# Patient Record
Sex: Female | Born: 1935 | Race: White | Hispanic: No | Smoking: Never smoker
Health system: Southern US, Community
[De-identification: ages and names within clinical notes are randomized; demographics above are authoritative.]

## PROBLEM LIST (undated history)

## (undated) DIAGNOSIS — E785 Hyperlipidemia, unspecified: Secondary | ICD-10-CM

## (undated) DIAGNOSIS — F0280 Dementia in other diseases classified elsewhere without behavioral disturbance: Secondary | ICD-10-CM

## (undated) DIAGNOSIS — I639 Cerebral infarction, unspecified: Secondary | ICD-10-CM

## (undated) DIAGNOSIS — R296 Repeated falls: Secondary | ICD-10-CM

## (undated) DIAGNOSIS — I10 Essential (primary) hypertension: Secondary | ICD-10-CM

## (undated) DIAGNOSIS — R32 Unspecified urinary incontinence: Secondary | ICD-10-CM

## (undated) DIAGNOSIS — R29898 Other symptoms and signs involving the musculoskeletal system: Secondary | ICD-10-CM

---

## 2019-03-26 ENCOUNTER — Encounter (HOSPITAL_COMMUNITY): Payer: Self-pay | Admitting: Emergency Medicine

## 2019-03-26 ENCOUNTER — Observation Stay (HOSPITAL_COMMUNITY)
Admission: EM | Admit: 2019-03-26 | Discharge: 2019-03-27 | Disposition: A | Discharge status: Home / Self Care | Payer: Medicare Other | Attending: Family Medicine | Admitting: Family Medicine

## 2019-03-26 ENCOUNTER — Emergency Department (HOSPITAL_COMMUNITY): Payer: Medicare Other

## 2019-03-26 ENCOUNTER — Other Ambulatory Visit: Payer: Self-pay

## 2019-03-26 DIAGNOSIS — G309 Alzheimer's disease, unspecified: Secondary | ICD-10-CM | POA: Insufficient documentation

## 2019-03-26 DIAGNOSIS — W19XXXA Unspecified fall, initial encounter: Secondary | ICD-10-CM | POA: Insufficient documentation

## 2019-03-26 DIAGNOSIS — F0280 Dementia in other diseases classified elsewhere without behavioral disturbance: Secondary | ICD-10-CM | POA: Diagnosis not present

## 2019-03-26 DIAGNOSIS — I629 Nontraumatic intracranial hemorrhage, unspecified: Secondary | ICD-10-CM

## 2019-03-26 DIAGNOSIS — Z8673 Personal history of transient ischemic attack (TIA), and cerebral infarction without residual deficits: Secondary | ICD-10-CM | POA: Insufficient documentation

## 2019-03-26 DIAGNOSIS — Z79899 Other long term (current) drug therapy: Secondary | ICD-10-CM | POA: Insufficient documentation

## 2019-03-26 DIAGNOSIS — R296 Repeated falls: Secondary | ICD-10-CM

## 2019-03-26 DIAGNOSIS — S06300A Unspecified focal traumatic brain injury without loss of consciousness, initial encounter: Secondary | ICD-10-CM | POA: Diagnosis not present

## 2019-03-26 DIAGNOSIS — Z20822 Contact with and (suspected) exposure to covid-19: Secondary | ICD-10-CM | POA: Insufficient documentation

## 2019-03-26 DIAGNOSIS — Z66 Do not resuscitate: Secondary | ICD-10-CM | POA: Diagnosis present

## 2019-03-26 DIAGNOSIS — Y999 Unspecified external cause status: Secondary | ICD-10-CM | POA: Insufficient documentation

## 2019-03-26 DIAGNOSIS — I10 Essential (primary) hypertension: Secondary | ICD-10-CM | POA: Diagnosis not present

## 2019-03-26 DIAGNOSIS — Z7982 Long term (current) use of aspirin: Secondary | ICD-10-CM | POA: Insufficient documentation

## 2019-03-26 DIAGNOSIS — Y92121 Bathroom in nursing home as the place of occurrence of the external cause: Secondary | ICD-10-CM | POA: Insufficient documentation

## 2019-03-26 DIAGNOSIS — S0003XA Contusion of scalp, initial encounter: Secondary | ICD-10-CM

## 2019-03-26 DIAGNOSIS — S0990XA Unspecified injury of head, initial encounter: Secondary | ICD-10-CM

## 2019-03-26 DIAGNOSIS — F0390 Unspecified dementia without behavioral disturbance: Secondary | ICD-10-CM | POA: Diagnosis present

## 2019-03-26 DIAGNOSIS — Y939 Activity, unspecified: Secondary | ICD-10-CM | POA: Diagnosis not present

## 2019-03-26 HISTORY — DX: Essential (primary) hypertension: I10

## 2019-03-26 HISTORY — DX: Unspecified urinary incontinence: R32

## 2019-03-26 HISTORY — DX: Cerebral infarction, unspecified: I63.9

## 2019-03-26 HISTORY — DX: Repeated falls: R29.6

## 2019-03-26 HISTORY — DX: Hyperlipidemia, unspecified: E78.5

## 2019-03-26 HISTORY — DX: Other symptoms and signs involving the musculoskeletal system: R29.898

## 2019-03-26 HISTORY — DX: Dementia in other diseases classified elsewhere, unspecified severity, without behavioral disturbance, psychotic disturbance, mood disturbance, and anxiety: F02.80

## 2019-03-26 LAB — BASIC METABOLIC PANEL
Anion gap: 11 (ref 5–15)
BUN: 24 mg/dL — ABNORMAL HIGH (ref 8–23)
CO2: 24 mmol/L (ref 22–32)
Calcium: 8.9 mg/dL (ref 8.9–10.3)
Chloride: 107 mmol/L (ref 98–111)
Creatinine, Ser: 1.21 mg/dL — ABNORMAL HIGH (ref 0.44–1.00)
GFR calc Af Amer: 48 mL/min — ABNORMAL LOW (ref >60)
GFR calc non Af Amer: 41 mL/min — ABNORMAL LOW (ref >60)
Glucose, Bld: 95 mg/dL (ref 70–99)
Potassium: 3.8 mmol/L (ref 3.5–5.1)
Sodium: 142 mmol/L (ref 135–145)

## 2019-03-26 LAB — CBC WITH DIFFERENTIAL/PLATELET
Abs Immature Granulocytes: 0.04 10*3/uL (ref 0.00–0.07)
Basophils Absolute: 0.1 10*3/uL (ref 0.0–0.1)
Basophils Relative: 1 %
Eosinophils Absolute: 0.1 10*3/uL (ref 0.0–0.5)
Eosinophils Relative: 2 %
HCT: 40.9 % (ref 36.0–46.0)
Hemoglobin: 13.1 g/dL (ref 12.0–15.0)
Immature Granulocytes: 1 %
Lymphocytes Relative: 23 %
Lymphs Abs: 1.7 10*3/uL (ref 0.7–4.0)
MCH: 30 pg (ref 26.0–34.0)
MCHC: 32 g/dL (ref 30.0–36.0)
MCV: 93.8 fL (ref 80.0–100.0)
Monocytes Absolute: 0.6 10*3/uL (ref 0.1–1.0)
Monocytes Relative: 7 %
Neutro Abs: 5.1 10*3/uL (ref 1.7–7.7)
Neutrophils Relative %: 66 %
Platelets: 227 10*3/uL (ref 150–400)
RBC: 4.36 MIL/uL (ref 3.87–5.11)
RDW: 13.3 % (ref 11.5–15.5)
WBC: 7.6 10*3/uL (ref 4.0–10.5)
nRBC: 0 % (ref 0.0–0.2)

## 2019-03-26 MED ORDER — ACETAMINOPHEN 325 MG PO TABS
650.0000 mg | ORAL_TABLET | Freq: Once | ORAL | Status: AC
  Administered 2019-03-26: 650 mg via ORAL
  Filled 2019-03-26: qty 2

## 2019-03-26 NOTE — ED Notes (Signed)
Patient transported to CT 

## 2019-03-26 NOTE — ED Notes (Signed)
Heather Carson daughter [phone] call with any updates

## 2019-03-26 NOTE — ED Provider Notes (Signed)
[facility] EMERGENCY DEPARTMENT Provider Note   CSN: [id number] Arrival date & time: 03/26/19  2030     History No chief complaint on file.   Heather Carson is a 86 y.o. female with past medical history of hypertension, prior history of stroke, Alzheimer's presenting to the ED after unwitnessed fall that occurred at nursing facility.  Patient states that she was in the bathroom when she lost her balance, fell and hit the back of her head on the wall.  She denies any loss of consciousness.  She complains of pain to the side of her head near the area of the hematoma.  She denies any vision changes, vomiting, numbness in arms or legs.  She is amatory at baseline with a walker and a cane.  She takes a daily aspirin but no other anticoagulation noted.  Denies any vomiting, diarrhea, chest pain, shortness of breath.  HPI     Past Medical History:  Diagnosis Date  . Alzheimer disease ([place])   . HLD (hyperlipidemia)   . Hypertension   . Incontinence   . Recurrent falls   . Stroke ([place])   . Weakness of both legs     There are no problems to display for this patient.    OB History   No obstetric history on file.     No family history on file.  Social History   Tobacco Use  . Smoking status: Not on file  Substance Use Topics  . Alcohol use: Not on file  . Drug use: Not on file    Home Medications Prior to Admission medications   Medication Sig Start Date End Date Taking? Authorizing Provider  ALPRAZolam Duanne Moron) 0.5 MG tablet Take 0.5 mg by mouth at bedtime.   Yes [provider]  amLODipine (NORVASC) 5 MG tablet Take 5 mg by mouth daily.   Yes [provider]  aspirin EC 81 MG tablet Take 81 mg by mouth daily.   Yes [provider]  buPROPion (WELLBUTRIN XL) 150 MG 24 hr tablet Take 150 mg by mouth daily.   Yes [provider]  losartan (COZAAR) 100 MG tablet Take 100 mg by mouth daily.   Yes [provider]    meclizine (ANTIVERT) 12.5 MG tablet Take 12.5 mg by mouth at bedtime as needed for dizziness.   Yes [provider]  Melatonin 10 MG SUBL Place 10 mg under the tongue at bedtime.   Yes [provider]  sertraline (ZOLOFT) 100 MG tablet Take 200 mg by mouth daily.   Yes [provider]  rivastigmine (EXELON) 4.6 mg/24hr Place 4.6 mg onto the skin daily.    [provider]    Allergies    Atorvastatin, Codeine, Other, and Sulfa antibiotics  Review of Systems   Review of Systems  Constitutional: Negative for appetite change, chills and fever.  HENT: Negative for ear pain, rhinorrhea, sneezing and sore throat.   Eyes: Negative for photophobia and visual disturbance.  Respiratory: Negative for cough, chest tightness, shortness of breath and wheezing.   Cardiovascular: Negative for chest pain and palpitations.  Gastrointestinal: Negative for abdominal pain, blood in stool, constipation, diarrhea, nausea and vomiting.  Genitourinary: Negative for dysuria, hematuria and urgency.  Musculoskeletal: Positive for neck pain. Negative for myalgias.  Skin: Negative for rash.  Neurological: Positive for headaches. Negative for dizziness, weakness and light-headedness.    Physical Exam Updated Vital Signs BP (!) 179/82 (BP Location: Left Arm)   Pulse  75   Temp 98.1 F (36.7 C) (Oral)   Resp 18   Ht 5\' 4"  (1.626 m)   Wt 72.6 kg   SpO2 97%   BMI 27.46 kg/m   Physical Exam Vitals and nursing note reviewed.  Constitutional:      General: She is not in acute distress.    Appearance: She is well-developed.  HENT:     Head: Normocephalic.      Nose: Nose normal.  Eyes:     General: No scleral icterus.       Right eye: No discharge.        Left eye: No discharge.     Conjunctiva/sclera: Conjunctivae normal.     Pupils: Pupils are equal, round, and reactive to light.  Neck:      Comments: TTP of paraspinal musculature and midline of the cervical  spine. Cardiovascular:     Rate and Rhythm: Normal rate and regular rhythm.     Heart sounds: Normal heart sounds. No murmur. No friction rub. No gallop.   Pulmonary:     Effort: Pulmonary effort is normal. No respiratory distress.     Breath sounds: Normal breath sounds.  Abdominal:     General: Bowel sounds are normal. There is no distension.     Palpations: Abdomen is soft.     Tenderness: There is no abdominal tenderness. There is no guarding.  Musculoskeletal:        General: Normal range of motion.     Cervical back: Normal range of motion and neck supple. Spinous process tenderness and muscular tenderness present.     Comments: No midline spinal tenderness present in lumbar, thoracic or cervical spine. No step-off palpated. No visible bruising, edema or temperature change noted. No objective signs of numbness present. No saddle anesthesia. 2+ DP pulses bilaterally.   Skin:    General: Skin is warm and dry.     Findings: No rash.     Comments: Large hematoma noted on head.  No wounds or active bleeding.  Neurological:     General: No focal deficit present.     Mental Status: She is alert.     Cranial Nerves: No cranial nerve deficit.     Sensory: No sensory deficit.     Motor: No weakness or abnormal muscle tone.     Coordination: Coordination normal.     Comments: Alert, oriented to self, place, situation. She knows the current year but not the current month.  No facial asymmetry noted. Pupils reactive. Cranial nerves appear grossly intact. Sensation intact to light touch on face, BUE and BLE. Strength 5/5 in BUE and BLE.      ED Results / Procedures / Treatments   Labs (all labs ordered are listed, but only abnormal results are displayed) Labs Reviewed  BASIC METABOLIC PANEL - Abnormal; Notable for the following components:      Result Value   BUN 24 (*)    Creatinine, Ser 1.21 (*)    GFR calc non Af Amer 41 (*)    GFR calc Af Amer 48 (*)    All other components within  normal limits  SARS CORONAVIRUS 2 (TAT 6-24 HRS)  CBC WITH DIFFERENTIAL/PLATELET    EKG None  Radiology CT Head Wo Contrast  Result Date: 03/26/2019 CLINICAL DATA:  Pain status post fall.  Right-sided hematoma. EXAM: CT HEAD WITHOUT CONTRAST CT CERVICAL SPINE WITHOUT CONTRAST TECHNIQUE: Multidetector CT imaging of the head and cervical spine was performed following the  standard protocol without intravenous contrast. Multiplanar CT image reconstructions of the cervical spine were also generated. COMPARISON:  12/04/2018. FINDINGS: CT HEAD FINDINGS Brain: Findings is suspicious for a trace amount of extra-axial hemorrhage along the posterior right convexity (sagittal series 6, image 16). There is no other area of acute intracranial hemorrhage. There is volume loss with chronic microvascular ischemic changes. There is no significant midline shift. Vascular: No hyperdense vessel or unexpected calcification. Skull: There is posterior right scalp swelling without evidence for an underlying calvarial fracture. Sinuses/Orbits: No acute finding. Other: None. CT CERVICAL SPINE FINDINGS Alignment: Normal. Skull base and vertebrae: No acute fracture. No primary bone lesion or focal pathologic process. Soft tissues and spinal canal: No prevertebral fluid or swelling. No visible canal hematoma. Disc levels: Multilevel degenerative changes are noted throughout the cervical spine, greatest at the C4-C5 and C5-C6 levels. Upper chest: Negative. Other: None IMPRESSION: 1. Trace extra-axial hemorrhage along the right occipital convexity as detailed above. No midline shift. 2. Right posterior scalp swelling with evidence for cephalhematoma. No underlying fracture. 3. No acute cervical spine fracture. 4. Age related volume loss and chronic microvascular ischemic changes are noted. These results were called by telephone at the time of interpretation on 03/26/2019 at 10:19 pm to provider [facility] , who verbally acknowledged these  results. Electronically Signed   By: Katherine Mantle M.D.   On: 03/26/2019 22:23   CT Cervical Spine Wo Contrast  Result Date: 03/26/2019 CLINICAL DATA:  Pain status post fall.  Right-sided hematoma. EXAM: CT HEAD WITHOUT CONTRAST CT CERVICAL SPINE WITHOUT CONTRAST TECHNIQUE: Multidetector CT imaging of the head and cervical spine was performed following the standard protocol without intravenous contrast. Multiplanar CT image reconstructions of the cervical spine were also generated. COMPARISON:  12/04/2018. FINDINGS: CT HEAD FINDINGS Brain: Findings is suspicious for a trace amount of extra-axial hemorrhage along the posterior right convexity (sagittal series 6, image 16). There is no other area of acute intracranial hemorrhage. There is volume loss with chronic microvascular ischemic changes. There is no significant midline shift. Vascular: No hyperdense vessel or unexpected calcification. Skull: There is posterior right scalp swelling without evidence for an underlying calvarial fracture. Sinuses/Orbits: No acute finding. Other: None. CT CERVICAL SPINE FINDINGS Alignment: Normal. Skull base and vertebrae: No acute fracture. No primary bone lesion or focal pathologic process. Soft tissues and spinal canal: No prevertebral fluid or swelling. No visible canal hematoma. Disc levels: Multilevel degenerative changes are noted throughout the cervical spine, greatest at the C4-C5 and C5-C6 levels. Upper chest: Negative. Other: None IMPRESSION: 1. Trace extra-axial hemorrhage along the right occipital convexity as detailed above. No midline shift. 2. Right posterior scalp swelling with evidence for cephalhematoma. No underlying fracture. 3. No acute cervical spine fracture. 4. Age related volume loss and chronic microvascular ischemic changes are noted. These results were called by telephone at the time of interpretation on 03/26/2019 at 10:19 pm to provider [facility] , who verbally acknowledged these results.  Electronically Signed   By: Katherine Mantle M.D.   On: 03/26/2019 22:23    Procedures .Critical Care Performed by: Dietrich Pates, PA-C Authorized by: Dietrich Pates, PA-C   Critical care provider statement:    Critical care time (minutes):  35   Critical care time was exclusive of:  Separately billable procedures and treating other patients   Critical care was necessary to treat or prevent imminent or life-threatening deterioration of the following conditions:  Cardiac failure, CNS failure or compromise, circulatory failure and  trauma   Critical care was time spent personally by me on the following activities:  Development of treatment plan with patient or surrogate, discussions with consultants, evaluation of patient's response to treatment, examination of patient, obtaining history from patient or surrogate, ordering and review of laboratory studies, ordering and review of radiographic studies, re-evaluation of patient's condition and review of old charts   I assumed direction of critical care for this patient from another provider in my specialty: no     (including critical care time)  Medications Ordered in ED Medications  acetaminophen (TYLENOL) tablet 650 mg (650 mg Oral Given 03/26/19 2235)    ED Course  I have reviewed the triage vital signs and the nursing notes.  Pertinent labs & imaging results that were available during my care of the patient were reviewed by me and considered in my medical decision making (see chart for details).    MDM Rules/Calculators/A&P                      86yo F with past medical history of Alzheimer's presenting to the ED after unwitnessed fall that occurred prior to arrival.  She was in the bathroom when she lost her balance, fell down and hit the back of her head on the wall.  She denies any loss of consciousness.  Patient currently takes aspirin daily.  Complaining of pain to the right side of her head at the site of a hematoma as well as neck  pain.  She has been ambulatory since the fall.  Denies any new back pain, hip pain, numbness in arms or legs, blurry vision. On exam patient is overall well appearing. She has no deficits to neurological exam. There is tenderness palpation of the cervical spine at the midline paraspinal musculature.  No hip tenderness or changes to range of motion.  He is alert and oriented x3.  Will obtain CT of the head and cervical spine and reassess.  CT of the head shows trace amount of hemorrhage along the right occipital convexity without midline shift.  CT of the cervical spine is unremarkable.  I spoke to Dr. Maisie Fus of neurosurgery who recommends repeat CT in the morning and reassess.  Feels that she should be suitable for discharge if the CT scan shows no worsening of her hemorrhage. I will admit her for observation and repeat CT scan to hospitalist service.   Final Clinical Impression(s) / ED Diagnoses Final diagnoses:  Unwitnessed fall  Injury of head, initial encounter  Traumatic intracranial hemorrhage without loss of consciousness, initial encounter [facility])    Rx / DC Orders ED Discharge Orders    None      Portions of this note were generated with Dragon dictation software. Dictation errors may occur despite best attempts at proofreading.    Dietrich Pates, PA-C 03/26/19 2338    Raeford Razor, MD 03/28/19 [facility]

## 2019-03-26 NOTE — ED Triage Notes (Addendum)
Pt coming by EMS from [facility] in [place] after an unwitnessed fall. Pt was in [place] when she fell and hit head on wall. Hematoma to right side of head. No LOC per staff/pt even though pt does have hx of dementia. Hx of HTN and stroke. BP 200/88, 78 HR, 95% O2, and 18 RR. Staff reported that pt is on blood thinners but the only ASA noted on med list for thinner

## 2019-03-26 NOTE — ED Notes (Signed)
Pt to CT at this time.

## 2019-03-27 ENCOUNTER — Encounter (HOSPITAL_COMMUNITY): Payer: Self-pay | Admitting: Internal Medicine

## 2019-03-27 ENCOUNTER — Observation Stay (HOSPITAL_COMMUNITY): Payer: Medicare Other

## 2019-03-27 DIAGNOSIS — R296 Repeated falls: Secondary | ICD-10-CM

## 2019-03-27 DIAGNOSIS — F0390 Unspecified dementia without behavioral disturbance: Secondary | ICD-10-CM | POA: Diagnosis present

## 2019-03-27 DIAGNOSIS — S06300A Unspecified focal traumatic brain injury without loss of consciousness, initial encounter: Secondary | ICD-10-CM

## 2019-03-27 DIAGNOSIS — I10 Essential (primary) hypertension: Secondary | ICD-10-CM

## 2019-03-27 DIAGNOSIS — Z66 Do not resuscitate: Secondary | ICD-10-CM | POA: Diagnosis present

## 2019-03-27 DIAGNOSIS — I629 Nontraumatic intracranial hemorrhage, unspecified: Secondary | ICD-10-CM

## 2019-03-27 LAB — BASIC METABOLIC PANEL
Anion gap: 5 (ref 5–15)
Anion gap: 12 (ref 5–15)
BUN: 20 mg/dL (ref 8–23)
BUN: 20 mg/dL (ref 8–23)
CO2: 26 mmol/L (ref 22–32)
CO2: 27 mmol/L (ref 22–32)
Calcium: 8.7 mg/dL — ABNORMAL LOW (ref 8.9–10.3)
Calcium: 9 mg/dL (ref 8.9–10.3)
Chloride: 109 mmol/L (ref 98–111)
Chloride: 102 mmol/L (ref 98–111)
Creatinine, Ser: 1.15 mg/dL — ABNORMAL HIGH (ref 0.44–1.00)
Creatinine, Ser: 1.01 mg/dL — ABNORMAL HIGH (ref 0.44–1.00)
GFR calc Af Amer: 51 mL/min — ABNORMAL LOW (ref >60)
GFR calc Af Amer: 60 mL/min — ABNORMAL LOW (ref >60)
GFR calc non Af Amer: 44 mL/min — ABNORMAL LOW (ref >60)
GFR calc non Af Amer: 51 mL/min — ABNORMAL LOW (ref >60)
Glucose, Bld: 100 mg/dL — ABNORMAL HIGH (ref 70–99)
Glucose, Bld: 101 mg/dL — ABNORMAL HIGH (ref 70–99)
Potassium: 3.5 mmol/L (ref 3.5–5.1)
Potassium: 3.4 mmol/L — ABNORMAL LOW (ref 3.5–5.1)
Sodium: 140 mmol/L (ref 135–145)
Sodium: 141 mmol/L (ref 135–145)

## 2019-03-27 LAB — SARS CORONAVIRUS 2 (TAT 6-24 HRS): SARS Coronavirus 2: NEGATIVE

## 2019-03-27 LAB — CBC
HCT: 38.9 % (ref 36.0–46.0)
Hemoglobin: 12.4 g/dL (ref 12.0–15.0)
MCH: 29.6 pg (ref 26.0–34.0)
MCHC: 31.9 g/dL (ref 30.0–36.0)
MCV: 92.8 fL (ref 80.0–100.0)
Platelets: 203 10*3/uL (ref 150–400)
RBC: 4.19 MIL/uL (ref 3.87–5.11)
RDW: 13.3 % (ref 11.5–15.5)
WBC: 6.3 10*3/uL (ref 4.0–10.5)
nRBC: 0 % (ref 0.0–0.2)

## 2019-03-27 LAB — MRSA PCR SCREENING: MRSA by PCR: NEGATIVE

## 2019-03-27 MED ORDER — HYDRALAZINE HCL 20 MG/ML IJ SOLN
10.0000 mg | INTRAMUSCULAR | Status: DC | PRN
  Filled 2019-03-27: qty 1

## 2019-03-27 MED ORDER — ALPRAZOLAM 0.5 MG PO TABS: 0.5000 mg | ORAL_TABLET | Freq: Every day | ORAL | Status: DC

## 2019-03-27 MED ORDER — ONDANSETRON HCL 4 MG/2ML IJ SOLN: 4.0000 mg | Freq: Four times a day (QID) | INTRAMUSCULAR | Status: DC | PRN

## 2019-03-27 MED ORDER — ASPIRIN EC 81 MG PO TBEC: 81.0000 mg | DELAYED_RELEASE_TABLET | Freq: Every day | ORAL | Status: AC

## 2019-03-27 MED ORDER — SERTRALINE HCL 100 MG PO TABS
200.0000 mg | ORAL_TABLET | Freq: Every day | ORAL | Status: DC
  Administered 2019-03-27: 200 mg via ORAL
  Filled 2019-03-27: qty 2

## 2019-03-27 MED ORDER — ACETAMINOPHEN 325 MG PO TABS: 650.0000 mg | ORAL_TABLET | Freq: Four times a day (QID) | ORAL | Status: DC | PRN

## 2019-03-27 MED ORDER — ONDANSETRON HCL 4 MG PO TABS: 4.0000 mg | ORAL_TABLET | Freq: Four times a day (QID) | ORAL | Status: DC | PRN

## 2019-03-27 MED ORDER — BUPROPION HCL ER (XL) 150 MG PO TB24
150.0000 mg | ORAL_TABLET | Freq: Every day | ORAL | Status: DC
  Administered 2019-03-27: 150 mg via ORAL
  Filled 2019-03-27: qty 1

## 2019-03-27 MED ORDER — MELATONIN 3 MG PO TABS
9.0000 mg | ORAL_TABLET | Freq: Every day | ORAL | Status: DC
  Filled 2019-03-27: qty 3

## 2019-03-27 MED ORDER — POTASSIUM CHLORIDE CRYS ER 20 MEQ PO TBCR
40.0000 meq | EXTENDED_RELEASE_TABLET | Freq: Once | ORAL | Status: AC
  Administered 2019-03-27: 40 meq via ORAL
  Filled 2019-03-27: qty 2

## 2019-03-27 MED ORDER — ACETAMINOPHEN 650 MG RE SUPP: 650.0000 mg | Freq: Four times a day (QID) | RECTAL | Status: DC | PRN

## 2019-03-27 MED ORDER — RIVASTIGMINE 4.6 MG/24HR TD PT24
4.6000 mg | MEDICATED_PATCH | Freq: Every day | TRANSDERMAL | Status: DC
  Administered 2019-03-27: 4.6 mg via TRANSDERMAL
  Filled 2019-03-27: qty 1

## 2019-03-27 MED ORDER — MECLIZINE HCL 12.5 MG PO TABS: 12.5000 mg | ORAL_TABLET | Freq: Every evening | ORAL | Status: DC | PRN

## 2019-03-27 MED ORDER — LOSARTAN POTASSIUM 50 MG PO TABS
100.0000 mg | ORAL_TABLET | Freq: Every day | ORAL | Status: DC
  Administered 2019-03-27: 11:00:00 100 mg via ORAL
  Filled 2019-03-27: qty 2

## 2019-03-27 MED ORDER — AMLODIPINE BESYLATE 5 MG PO TABS
5.0000 mg | ORAL_TABLET | Freq: Every day | ORAL | Status: DC
  Administered 2019-03-27: 5 mg via ORAL
  Filled 2019-03-27: qty 1

## 2019-03-27 NOTE — Discharge Instructions (Signed)

## 2019-03-27 NOTE — Progress Notes (Signed)
Psychosocial section of admin documentation not completed d/t pt's daughter being present in room. Pt AOx4 and able to answer question at time.

## 2019-03-27 NOTE — Progress Notes (Signed)
Neurosurgery  86 yo F s/p fall on aspirin, hitting her head.  On my interpretation, her initial CT head without contrast did not show any intracranial abnormality but radiology read mentioned a possible trace extra-axial intracranial hemorrhage.  As such, I recommended a repeat CT head which again showed no intracranial abnormality.  No neurosurgical intervention or follow-up is required.

## 2019-03-27 NOTE — Discharge Summary (Addendum)
Physician Discharge Summary  Heather Carson OXB:[id number] DOB: [birth year] DOA: 03/26/2019  PCP: Leola Brazil, DO  Admit date: 03/26/2019 Discharge date: 03/27/2019  Admitted From: Home Disposition: Home  Recommendations for Outpatient Follow-up:  1. Follow up with PCP in 1-2 weeks 2. Please obtain BMP/CBC in one week 3. Please follow up on the following pending results:  Home Health: Yes Equipment/Devices: None  Discharge Condition: Stable CODE STATUS: The Diet recommendation: Cardiac  Subjective: Seen and examined.  Daughter at the bedside.  She has no complaints.  HPI: Heather Carson is a 86 y.o. female with history of dementia, stroke, hypertension depression who was just recently moved to assisted living facility last week was found on the floor in the bathroom by the staff.  Patient usually walks with help of a walker as per the daughter.  Patient was not unconscious and was not able to provide history with regarding to the circumstances of the fall.  Has not complained of any chest pain has not had any shortness of breath fever chills or vomiting or diarrhea.  ED Course: In the ER patient appeared nonfocal and some bruise was found to the occipital area of the scalp.  CT of the head and C-spine was done which shows trace extra-axial hemorrhage with no mass-effect on the occipital area and also some scalp hematoma.  On-call neurosurgeon Dr. Maisie Fus was consulted by the ER physician who at this time advised observation and repeat CT head in the morning.  At the time of my exam patient is alert awake oriented to name and moves all extremities.  EKG shows normal sinus rhythm.  Covid test is pending.  Labs show unremarkable CBC creatinine 1.2 with GFR of 41 which is comparable to the one recently done in care everywhere.  Brief/Interim Summary: Patient was admitted with a scalp hematoma and concern of possible intracranial hemorrhage.  Neurosurgery was consulted.  They recommended  repeat CT head which ruled out intracranial hemorrhage.  Following is a copy placed consultation note from neurosurgeon Dr. Maisie Fus:  "86 yo F s/p fall on aspirin, hitting her head.  On my interpretation, her initial CT head without contrast did not show any intracranial abnormality but radiology read mentioned a possible trace extra-axial intracranial hemorrhage.  As such, I recommended a repeat CT head which again showed no intracranial abnormality.  No neurosurgical intervention or follow-up is required."  Patient has been cleared by neurosurgery and she has no symptoms or any focal deficit.  Although repeat CT head shows slightly enlarged scalp hematoma however she has been cleared by neurosurgery so she will be discharged today.  There was no recommendation to hold her aspirin however to be on the safe side, I am going to hold her aspirin until Monday.  PT saw her and they recommended home health PT which has been ordered for her.  Discharge Diagnoses:  Principal Problem:   Traumatic intracranial hemorrhage without loss of consciousness (HCC) Active Problems:   Unwitnessed fall   Essential hypertension   DNR (do not resuscitate)   Dementia (HCC)   Intracranial bleed [facility])    Discharge Instructions  Discharge Instructions    Discharge patient   Complete by: As directed    Discharge disposition: 06-Home-Health Care Svc   Discharge patient date: 03/27/2019     Allergies as of 03/27/2019      Reactions   Atorvastatin Other (See Comments)   Hip pain which resolved with cessation of the atorvastatin Hip pain which resolved  with cessation of the atorvastatin Hip pain which resolved with cessation of the atorvastatin Hip pain which resolved with cessation of the atorvastatin   Codeine Nausea And Vomiting   Other Rash   Sulfa Antibiotics Rash      Medication List    TAKE these medications   ALPRAZolam 0.5 MG tablet Commonly known as: XANAX Take 0.5 mg by mouth at bedtime.    amLODipine 5 MG tablet Commonly known as: NORVASC Take 5 mg by mouth daily.   aspirin EC 81 MG tablet Take 1 tablet (81 mg total) by mouth daily. Start taking on: March 31, 2019 What changed: These instructions start on March 31, 2019. If you are unsure what to do until then, ask your doctor or other care provider.   buPROPion 150 MG 24 hr tablet Commonly known as: WELLBUTRIN XL Take 150 mg by mouth daily.   losartan 100 MG tablet Commonly known as: COZAAR Take 100 mg by mouth daily.   meclizine 12.5 MG tablet Commonly known as: ANTIVERT Take 12.5 mg by mouth at bedtime as needed for dizziness.   Melatonin 10 MG Subl Place 10 mg under the tongue at bedtime.   rivastigmine 4.6 mg/24hr Commonly known as: EXELON Place 4.6 mg onto the skin daily.   sertraline 100 MG tablet Commonly known as: ZOLOFT Take 200 mg by mouth daily.      Follow-up Information    Hope Pigeon B, DO Follow up in 1 week(s).   Specialty: Internal Medicine Contact information: [address] [place] [address] [phone]          Allergies  Allergen Reactions  . Atorvastatin Other (See Comments)    Hip pain which resolved with cessation of the atorvastatin Hip pain which resolved with cessation of the atorvastatin Hip pain which resolved with cessation of the atorvastatin Hip pain which resolved with cessation of the atorvastatin   . Codeine Nausea And Vomiting  . Other Rash  . Sulfa Antibiotics Rash    Consultations: Neurosurgery   Procedures/Studies: CT HEAD WO CONTRAST  Result Date: 03/27/2019 CLINICAL DATA:  86 year old female status post fall with suspicion of trace probable subarachnoid hemorrhage over the right occiput on CT yesterday. EXAM: CT HEAD WITHOUT CONTRAST TECHNIQUE: Contiguous axial images were obtained from the base of the skull through the vertex without intravenous contrast. COMPARISON:  Head and cervical spine CT yesterday. Brain  MRI 10/04/2018. FINDINGS: Brain: No intracranial mass effect. No ventriculomegaly. Stable gray-white matter differentiation throughout the brain. Patchy white matter hypodensity including deep white matter capsule in some deep gray nuclei involvement. No cortically based acute infarct identified. Dural calcifications again noted. No persistent hemorrhagic focus identified at the area of previously seen along the right occiput (sagittal image 15 today). No intracranial hemorrhage identified. Vascular: Calcified atherosclerosis at the skull base. Skull: No skull fracture identified. Sinuses/Orbits: Visualized paranasal sinuses and mastoids are clear. Other: Broad-based right posterior convexity scalp hematoma has increased in size since yesterday, and is now up to 18 millimeters in thickness. No scalp soft tissue gas associated. Underlying calvarium intact. Stable and negative orbits. IMPRESSION: 1. No evidence of intracranial hemorrhage today. No acute intracranial abnormality. 2. Increased right posterior convexity scalp hematoma since yesterday but no underlying skull fracture identified. 3. Cerebral chronic small vessel disease. Electronically Signed   By: Odessa Fleming M.D.   On: 03/27/2019 07:22   CT Head Wo Contrast  Result Date: 03/26/2019 CLINICAL DATA:  Pain status post fall.  Right-sided hematoma. EXAM: CT HEAD WITHOUT CONTRAST CT CERVICAL SPINE WITHOUT CONTRAST TECHNIQUE: Multidetector CT imaging of the head and cervical spine was performed following the standard protocol without intravenous contrast. Multiplanar CT image reconstructions of the cervical spine were also generated. COMPARISON:  12/04/2018. FINDINGS: CT HEAD FINDINGS Brain: Findings is suspicious for a trace amount of extra-axial hemorrhage along the posterior right convexity (sagittal series 6, image 16). There is no other area of acute intracranial hemorrhage. There is volume loss with chronic microvascular ischemic changes. There is no  significant midline shift. Vascular: No hyperdense vessel or unexpected calcification. Skull: There is posterior right scalp swelling without evidence for an underlying calvarial fracture. Sinuses/Orbits: No acute finding. Other: None. CT CERVICAL SPINE FINDINGS Alignment: Normal. Skull base and vertebrae: No acute fracture. No primary bone lesion or focal pathologic process. Soft tissues and spinal canal: No prevertebral fluid or swelling. No visible canal hematoma. Disc levels: Multilevel degenerative changes are noted throughout the cervical spine, greatest at the C4-C5 and C5-C6 levels. Upper chest: Negative. Other: None IMPRESSION: 1. Trace extra-axial hemorrhage along the right occipital convexity as detailed above. No midline shift. 2. Right posterior scalp swelling with evidence for cephalhematoma. No underlying fracture. 3. No acute cervical spine fracture. 4. Age related volume loss and chronic microvascular ischemic changes are noted. These results were called by telephone at the time of interpretation on 03/26/2019 at 10:19 pm to provider [facility] , who verbally acknowledged these results. Electronically Signed   By: Katherine Mantle M.D.   On: 03/26/2019 22:23   CT Cervical Spine Wo Contrast  Result Date: 03/26/2019 CLINICAL DATA:  Pain status post fall.  Right-sided hematoma. EXAM: CT HEAD WITHOUT CONTRAST CT CERVICAL SPINE WITHOUT CONTRAST TECHNIQUE: Multidetector CT imaging of the head and cervical spine was performed following the standard protocol without intravenous contrast. Multiplanar CT image reconstructions of the cervical spine were also generated. COMPARISON:  12/04/2018. FINDINGS: CT HEAD FINDINGS Brain: Findings is suspicious for a trace amount of extra-axial hemorrhage along the posterior right convexity (sagittal series 6, image 16). There is no other area of acute intracranial hemorrhage. There is volume loss with chronic microvascular ischemic changes. There is no significant  midline shift. Vascular: No hyperdense vessel or unexpected calcification. Skull: There is posterior right scalp swelling without evidence for an underlying calvarial fracture. Sinuses/Orbits: No acute finding. Other: None. CT CERVICAL SPINE FINDINGS Alignment: Normal. Skull base and vertebrae: No acute fracture. No primary bone lesion or focal pathologic process. Soft tissues and spinal canal: No prevertebral fluid or swelling. No visible canal hematoma. Disc levels: Multilevel degenerative changes are noted throughout the cervical spine, greatest at the C4-C5 and C5-C6 levels. Upper chest: Negative. Other: None IMPRESSION: 1. Trace extra-axial hemorrhage along the right occipital convexity as detailed above. No midline shift. 2. Right posterior scalp swelling with evidence for cephalhematoma. No underlying fracture. 3. No acute cervical spine fracture. 4. Age related volume loss and chronic microvascular ischemic changes are noted. These results were called by telephone at the time of interpretation on 03/26/2019 at 10:19 pm to provider [facility] , who verbally acknowledged these results. Electronically Signed   By: Katherine Mantle M.D.   On: 03/26/2019 22:23      Discharge Exam: Vitals:   03/27/19 0759 03/27/19 1055  BP: (!) 143/59 (!) 143/59  Pulse: 76   Resp: 19   Temp: 98.2 F (36.8 C)   SpO2: 96%    Vitals:   03/27/19 0308 03/27/19 0310 03/27/19  0759 03/27/19 1055  BP: (!) 136/55 (!) 127/55 (!) 143/59 (!) 143/59  Pulse: 70  76   Resp: 18  19   Temp: (!) 97.4 F (36.3 C) 97.8 F (36.6 C) 98.2 F (36.8 C)   TempSrc: Oral Axillary Oral   SpO2: 94%  96%   Weight:      Height:        General: Pt is alert, awake, not in acute distress Cardiovascular: RRR, S1/S2 +, no rubs, no gallops Respiratory: CTA bilaterally, no wheezing, no rhonchi Abdominal: Soft, NT, ND, bowel sounds + Extremities: no edema, no cyanosis    The results of significant diagnostics from this  hospitalization (including imaging, microbiology, ancillary and laboratory) are listed below for reference.     Microbiology: Recent Results (from the past 240 hour(s))  SARS CORONAVIRUS 2 (TAT 6-24 HRS) Nasopharyngeal Nasopharyngeal Swab     Status: None   Collection Time: 03/26/19 11:44 PM   Specimen: Nasopharyngeal Swab  Result Value Ref Range Status   SARS Coronavirus 2 NEGATIVE NEGATIVE Final    Comment: (NOTE) SARS-CoV-2 target nucleic acids are NOT DETECTED. The SARS-CoV-2 RNA is generally detectable in upper and lower respiratory specimens during the acute phase of infection. Negative results do not preclude SARS-CoV-2 infection, do not rule out co-infections with other pathogens, and should not be used as the sole basis for treatment or other patient management decisions. Negative results must be combined with clinical observations, patient history, and epidemiological information. The expected result is Negative. Fact Sheet for Patients: HairSlick.no Fact Sheet for Healthcare Providers: quierodirigir.com This test is not yet approved or cleared by the [place] FDA and  has been authorized for detection and/or diagnosis of SARS-CoV-2 by FDA under an Emergency Use Authorization (EUA). This EUA will remain  in effect (meaning this test can be used) for the duration of the COVID-19 declaration under Section 56 4(b)(1) of the Act, 21 U.S.C. section 360bbb-3(b)(1), unless the authorization is terminated or revoked sooner. Performed at [facility], [address], [place], [place] [address]   MRSA PCR Screening     Status: None   Collection Time: 03/27/19  2:29 AM   Specimen: Nasopharyngeal  Result Value Ref Range Status   MRSA by PCR NEGATIVE NEGATIVE Final    Comment:        The GeneXpert MRSA Assay (FDA approved for NASAL specimens only), is one component of a comprehensive MRSA  colonization surveillance program. It is not intended to diagnose MRSA infection nor to guide or monitor treatment for MRSA infections. Performed at [facility], [address] [place], [place] [address]      Labs: BNP (last 3 results) No results for input(s): BNP in the last 8760 hours. Basic Metabolic Panel: Recent Labs  Lab 03/26/19 2242 03/27/19 0531 03/27/19 0604  NA 142 140 141  K 3.8 3.5 3.4*  CL 107 109 102  CO2 24 26 27   GLUCOSE 95 100* 101*  BUN 24* 20 20  CREATININE 1.21* 1.15* 1.01*  CALCIUM 8.9 8.7* 9.0   Liver Function Tests: No results for input(s): AST, ALT, ALKPHOS, BILITOT, PROT, ALBUMIN in the last 168 hours. No results for input(s): LIPASE, AMYLASE in the last 168 hours. No results for input(s): AMMONIA in the last 168 hours. CBC: Recent Labs  Lab 03/26/19 2242 03/27/19 0531  WBC 7.6 6.3  NEUTROABS 5.1  --   HGB 13.1 12.4  HCT 40.9 38.9  MCV 93.8 92.8  PLT 227 203   Cardiac Enzymes: No results for input(s): CKTOTAL, CKMB, CKMBINDEX, TROPONINI in the last 168 hours. BNP: Invalid input(s): POCBNP CBG: No results for input(s): GLUCAP in the last 168 hours. D-Dimer No results for input(s): DDIMER in the last 72 hours. Hgb A1c No results for input(s): HGBA1C in the last 72 hours. Lipid Profile No results for input(s): CHOL, HDL, LDLCALC, TRIG, CHOLHDL, LDLDIRECT in the last 72 hours. Thyroid function studies No results for input(s): TSH, T4TOTAL, T3FREE, THYROIDAB in the last 72 hours.  Invalid input(s): FREET3 Anemia work up No results for input(s): VITAMINB12, FOLATE, FERRITIN, TIBC, IRON, RETICCTPCT in the last 72 hours. Urinalysis No results found for: COLORURINE, APPEARANCEUR, [place], [place], [place], [place], [place], [place], PROTEINUR, UROBILINOGEN, NITRITE, LEUKOCYTESUR Sepsis Labs Invalid input(s): PROCALCITONIN,  WBC,  LACTICIDVEN Microbiology Recent Results (from the past 240 hour(s))  SARS CORONAVIRUS 2  (TAT 6-24 HRS) Nasopharyngeal Nasopharyngeal Swab     Status: None   Collection Time: 03/26/19 11:44 PM   Specimen: Nasopharyngeal Swab  Result Value Ref Range Status   SARS Coronavirus 2 NEGATIVE NEGATIVE Final    Comment: (NOTE) SARS-CoV-2 target nucleic acids are NOT DETECTED. The SARS-CoV-2 RNA is generally detectable in upper and lower respiratory specimens during the acute phase of infection. Negative results do not preclude SARS-CoV-2 infection, do not rule out co-infections with other pathogens, and should not be used as the sole basis for treatment or other patient management decisions. Negative results must be combined with clinical observations, patient history, and epidemiological information. The expected result is Negative. Fact Sheet for Patients: SugarRoll.be Fact Sheet for Healthcare Providers: https://www.woods-mathews.com/ This test is not yet approved or cleared by the [place] FDA and  has been authorized for detection and/or diagnosis of SARS-CoV-2 by FDA under an Emergency Use Authorization (EUA). This EUA will remain  in effect (meaning this test can be used) for the duration of the COVID-19 declaration under Section 56 4(b)(1) of the Act, 21 U.S.C. section 360bbb-3(b)(1), unless the authorization is terminated or revoked sooner. Performed at [facility], [place] [address] [place], [place] [address]   MRSA PCR Screening     Status: None   Collection Time: 03/27/19  2:29 AM   Specimen: Nasopharyngeal  Result Value Ref Range Status   MRSA by PCR NEGATIVE NEGATIVE Final    Comment:        The GeneXpert MRSA Assay (FDA approved for NASAL specimens only), is one component of a comprehensive MRSA colonization surveillance program. It is not intended to diagnose MRSA infection nor to guide or monitor treatment for MRSA infections. Performed at [facility], [place] [address], [place], [place] [address]       Time coordinating discharge: Over 30 minutes  SIGNED:   Darliss Cheney, MD  [facility] 03/27/2019, 11:24 AM  If 7PM-7AM, please contact night-coverage [email or web address]

## 2019-03-27 NOTE — TOC Transition Note (Signed)
Transition of Care [facility]) - CM/SW Discharge Note   Patient Details  Name: Heather Carson MRN: [id number] Date of Birth: 1933/08/16  Transition of [facility]) CM/SW Contact:  Terrilee Croak, Student-Social Work Phone Number: 03/27/2019, 12:01 PM   Clinical Narrative:    Nurse to call report to [phone] Rm # [phone]   Final next level of care: Assisted Living Barriers to Discharge: Barriers Resolved   Patient Goals and CMS Choice        Discharge Placement              Patient chooses bed at: [facility]) Patient to be transferred to facility by: Arline Asp, Daughter Name of family member notified: Arline Asp Patient and family notified of of transfer: 03/27/19  Discharge Plan and Services                                     Social Determinants of Health (SDOH) Interventions     Readmission Risk Interventions No flowsheet data found.

## 2019-03-27 NOTE — NC FL2 (Addendum)
[place] MEDICAID FL2 LEVEL OF CARE SCREENING TOOL     IDENTIFICATION  Patient Name: Heather Carson Birthdate: 1933-04-24 Sex: female Admission Date (Current Location): 03/26/2019  [facility] and [place] Number:  [employer] and Address:  [facility] [place], [address], [place], [place] [address]      Provider Number: [id number]  Attending Physician Name and Address:  Hughie Closs, MD  Relative Name and Phone Number:       Current Level of Care: Hospital Recommended Level of Care: Assisted Living Facility Prior Approval Number:    Date Approved/Denied:   PASRR Number:    Discharge Plan: Other (Comment)(ALF)    Current Diagnoses: Patient Active Problem List   Diagnosis Date Noted  . Unwitnessed fall 03/27/2019  . Essential hypertension 03/27/2019  . DNR (do not resuscitate) 03/27/2019  . Dementia (HCC) 03/27/2019  . Intracranial bleed (HCC) 03/27/2019  . Traumatic intracranial hemorrhage without loss of consciousness (HCC) 03/26/2019    Orientation RESPIRATION BLADDER Height & Weight     Self, Time, Situation, Place  Normal Incontinent Weight: 160 lb (72.6 kg) Height:  5\' 4"  (162.6 cm)  BEHAVIORAL SYMPTOMS/MOOD NEUROLOGICAL BOWEL NUTRITION STATUS      Continent Diet(regular)  AMBULATORY STATUS COMMUNICATION OF NEEDS Skin   Limited Assist Verbally Normal                       Personal Care Assistance Level of Assistance  Bathing, Feeding, Dressing Bathing Assistance: Limited assistance Feeding assistance: Independent Dressing Assistance: Limited assistance     Functional Limitations Info             SPECIAL CARE FACTORS FREQUENCY  PT (By licensed PT)     PT Frequency: 3x/wk with home health              Contractures Contractures Info: Not present    Additional Factors Info  Code Status, Allergies, Psychotropic Code Status Info: DNR Allergies Info: Atorvastatin, Codeine, Other, Sulfa  Antibiotics Psychotropic Info: Xanax 0.5mg  daily at bed; Wellbutrin XL 150mg  daily; Zoloft 200mg  daily         Current Medications (03/27/2019):  This is the current hospital active medication list Current Facility-Administered Medications  Medication Dose Route Frequency Provider Last Rate Last Admin  . acetaminophen (TYLENOL) tablet 650 mg  650 mg Oral Q6H PRN , MD       Or  . acetaminophen (TYLENOL) suppository 650 mg  650 mg Rectal Q6H PRN , MD      . ALPRAZolam 05/25/2019) tablet 0.5 mg  0.5 mg Oral QHS Eduard Clos, MD      . amLODipine (NORVASC) tablet 5 mg  5 mg Oral Daily Eduard Clos, MD   5 mg at 03/27/19 1055  . buPROPion (WELLBUTRIN XL) 24 hr tablet 150 mg  150 mg Oral Daily Eduard Clos, MD   150 mg at 03/27/19 1055  . hydrALAZINE (APRESOLINE) injection 10 mg  10 mg Intravenous Q4H PRN 05/25/19, MD      . losartan (COZAAR) tablet 100 mg  100 mg Oral Daily Eduard Clos, MD   100 mg at 03/27/19 1055  . meclizine (ANTIVERT) tablet 12.5 mg  12.5 mg Oral QHS PRN Eduard Clos, MD      . Melatonin TABS 9 mg  9 mg Sublingual QHS Eduard Clos, MD      . ondansetron [facility]) tablet 4  mg  4 mg Oral Q6H PRN Rise Patience, MD       Or  . ondansetron [facility]) injection 4 mg  4 mg Intravenous Q6H PRN Rise Patience, MD      . rivastigmine (EXELON) 4.6 mg/24hr 4.6 mg  4.6 mg Transdermal Daily Rise Patience, MD   4.6 mg at 03/27/19 1055  . sertraline (ZOLOFT) tablet 200 mg  200 mg Oral Daily Rise Patience, MD   200 mg at 03/27/19 1055     Discharge Medications: ALPRAZolam 0.5 MG tablet Commonly known as: XANAX Take 0.5 mg by mouth at bedtime.   amLODipine 5 MG tablet Commonly known as: NORVASC Take 5 mg by mouth daily.   aspirin EC 81 MG tablet Take 1 tablet (81 mg total) by mouth daily. Start taking on: March 31, 2019 What changed: These instructions start  on March 31, 2019. If you are unsure what to do until then, ask your doctor or other care provider.   buPROPion 150 MG 24 hr tablet Commonly known as: WELLBUTRIN XL Take 150 mg by mouth daily.   losartan 100 MG tablet Commonly known as: COZAAR Take 100 mg by mouth daily.   meclizine 12.5 MG tablet Commonly known as: ANTIVERT Take 12.5 mg by mouth at bedtime as needed for dizziness.   Melatonin 10 MG Subl Place 10 mg under the tongue at bedtime.   rivastigmine 4.6 mg/24hr Commonly known as: EXELON Place 4.6 mg onto the skin daily.   sertraline 100 MG tablet Commonly known as: ZOLOFT Take 200 mg by mouth daily.     Relevant Imaging Results:  Relevant Lab Results:   Additional Information SS#: [id number]  Geralynn Ochs, LCSW

## 2019-03-27 NOTE — Progress Notes (Signed)
Marchelle Folks at [facility] [place] called  at [phone] for update.

## 2019-03-27 NOTE — Evaluation (Signed)
Physical Therapy Evaluation Patient Details Name: Heather Carson MRN: [id number] DOB: [birth year] Today's Date: 03/27/2019   History of Present Illness  86 yo female admitted from ALF after staff found her on the bathroom floor. Pt with occipital ICH. PMHx: Alzheimers, CVA, HTN, depression, falls, HLD, incontinence  Clinical Impression  Pt very pleasant and moving well with need for cues for safely transfer and use RW. Daughter present and reports pt at baseline cognition and speech currently. Daughter reports 2 falls in the last week and discussed plan to transition life alert system pt has at her house to ALF so staff can be notified faster when she falls. Also educated pt and daughter on bil LE HEP for anterior strengthening, RW use and increased activity with phone alarms to remind pt of need for activity. PT with decreased activity, strength and function with frequent falls who will benefit from acute therapy to maximize mobility, safety and function.     Follow Up Recommendations Home health PT;Supervision for mobility/OOB(life alert on person type of system)    Equipment Recommendations  None recommended by PT    Recommendations for Other Services       Precautions / Restrictions Precautions Precautions: Fall Restrictions Weight Bearing Restrictions: No      Mobility  Bed Mobility Overal bed mobility: Modified Independent                Transfers Overall transfer level: Needs assistance   Transfers: Sit to/from Stand Sit to Stand: Supervision         General transfer comment: cues for hand placement not to pull on RW and reach for chair  Ambulation/Gait Ambulation/Gait assistance: Supervision Gait Distance (Feet): 200 Feet Assistive device: Rolling walker (2 wheeled) Gait Pattern/deviations: Step-through pattern;Decreased stride length;Trunk flexed   Gait velocity interpretation: 1.31 - 2.62 ft/sec, indicative of limited community ambulator General Gait  Details: cues for hand placement and proximity to RW. Pt with mod cues for RW use and direction which daughter also reports as baseline despite repeated education and various cueing reminders  Stairs            Wheelchair Mobility    Modified Rankin (Stroke Patients Only)       Balance Overall balance assessment: History of Falls                                           Pertinent Vitals/Pain Pain Assessment: No/denies pain    Home Living Family/patient expects to be discharged to:: Assisted living               Home Equipment: Walker - 2 wheels;Shower seat;Wheelchair - manual      Prior Function Level of Independence: Needs assistance   Gait / Transfers Assistance Needed: RW for gait although daughter reports she performs better at times without aD  ADL's / Homemaking Assistance Needed: staff assist for iADLs pt performs ADLs        Hand Dominance        Extremity/Trunk Assessment   Upper Extremity Assessment Upper Extremity Assessment: Generalized weakness    Lower Extremity Assessment Lower Extremity Assessment: Generalized weakness    Cervical / Trunk Assessment Cervical / Trunk Assessment: Kyphotic  Communication   Communication: No difficulties  Cognition Arousal/Alertness: Awake/alert Behavior During Therapy: WFL for tasks assessed/performed Overall Cognitive Status: History of cognitive impairments - at baseline  General Comments: pt with decreased memory and safety awareness with daughter present to report baseline functional status      General Comments General comments (skin integrity, edema, etc.): 2 falls in the last week with frequent falls typically posteriorly or laterally    Exercises     Assessment/Plan    PT Assessment Patient needs continued PT services  PT Problem List Decreased strength;Decreased mobility;Decreased safety awareness;Decreased activity  tolerance;Decreased balance       PT Treatment Interventions DME instruction;Therapeutic exercise;Gait training;Balance training;Functional mobility training;Therapeutic activities;Patient/family education;Cognitive remediation    PT Goals (Current goals can be found in the Care Plan section)  Acute Rehab PT Goals Patient Stated Goal: return home PT Goal Formulation: With patient/family Time For Goal Achievement: 04/10/19 Potential to Achieve Goals: Good    Frequency Min 3X/week   Barriers to discharge Decreased caregiver support      Co-evaluation               AM-PAC PT "6 Clicks" Mobility  Outcome Measure Help needed turning from your back to your side while in a flat bed without using bedrails?: None Help needed moving from lying on your back to sitting on the side of a flat bed without using bedrails?: None Help needed moving to and from a bed to a chair (including a wheelchair)?: A Little Help needed standing up from a chair using your arms (e.g., wheelchair or bedside chair)?: A Little Help needed to walk in hospital room?: A Little Help needed climbing 3-5 steps with a railing? : A Little 6 Click Score: 20    End of Session Equipment Utilized During Treatment: Gait belt Activity Tolerance: Patient tolerated treatment well Patient left: in chair;with call bell/phone within reach;with chair alarm set;with family/visitor present Nurse Communication: Mobility status;Precautions PT Visit Diagnosis: Other abnormalities of gait and mobility (R26.89)    Time: 6962-9528 PT Time Calculation (min) (ACUTE ONLY): 29 min   Charges:   PT Evaluation $PT Eval Moderate Complexity: 1 Mod PT Treatments $Gait Training: 8-22 mins        Heather Carson, PT Acute Rehabilitation Services Pager: [phone] Office: [place] 03/27/2019, 11:11 AM

## 2019-03-27 NOTE — H&P (Signed)
History and Physical    Heather Carson YDX:[id number] DOB: 1933-07-27 DOA: 03/26/2019  PCP: Leola Brazil, DO  Patient coming from: Assisted living facility.  History obtained from patient's daughter.  Patient has dementia.  Chief Complaint: Fall.  HPI: Heather Carson is a 86 y.o. female with history of dementia, stroke, hypertension depression who was just recently moved to assisted living facility last week was found on the floor in the bathroom by the staff.  Patient usually walks with help of a walker as per the daughter.  Patient was not unconscious and was not able to provide history with regarding to the circumstances of the fall.  Has not complained of any chest pain has not had any shortness of breath fever chills or vomiting or diarrhea.  ED Course: In the ER patient appeared nonfocal and some bruise was found to the occipital area of the scalp.  CT of the head and C-spine was done which shows trace extra-axial hemorrhage with no mass-effect on the occipital area and also some scalp hematoma.  On-call neurosurgeon Dr. Maisie Fus was consulted by the ER physician who at this time advised observation and repeat CT head in the morning.  At the time of my exam patient is alert awake oriented to name and moves all extremities.  EKG shows normal sinus rhythm.  Covid test is pending.  Labs show unremarkable CBC creatinine 1.2 with GFR of 41 which is comparable to the one recently done in care everywhere.  Review of Systems: As per HPI, rest all negative.   Past Medical History:  Diagnosis Date   Alzheimer disease (HCC)    HLD (hyperlipidemia)    Hypertension    Incontinence    Recurrent falls    Stroke (HCC)    Weakness of both legs     History reviewed. No pertinent surgical history.   reports that she has never smoked. She has never used smokeless tobacco. She reports that she does not drink alcohol or use drugs.  Allergies  Allergen Reactions   Atorvastatin Other  (See Comments)    Hip pain which resolved with cessation of the atorvastatin Hip pain which resolved with cessation of the atorvastatin Hip pain which resolved with cessation of the atorvastatin Hip pain which resolved with cessation of the atorvastatin    Codeine Nausea And Vomiting   Other Rash   Sulfa Antibiotics Rash    Family History  Family history unknown: Yes    Prior to Admission medications   Medication Sig Start Date End Date Taking? Authorizing Provider  ALPRAZolam Prudy Feeler) 0.5 MG tablet Take 0.5 mg by mouth at bedtime.   Yes [provider]  amLODipine (NORVASC) 5 MG tablet Take 5 mg by mouth daily.   Yes [provider]  aspirin EC 81 MG tablet Take 81 mg by mouth daily.   Yes [provider]  buPROPion (WELLBUTRIN XL) 150 MG 24 hr tablet Take 150 mg by mouth daily.   Yes [provider]  losartan (COZAAR) 100 MG tablet Take 100 mg by mouth daily.   Yes [provider]  meclizine (ANTIVERT) 12.5 MG tablet Take 12.5 mg by mouth at bedtime as needed for dizziness.   Yes [provider]  Melatonin 10 MG SUBL Place 10 mg under the tongue at bedtime.   Yes [provider]  sertraline (ZOLOFT) 100 MG tablet Take 200 mg by mouth daily.   Yes [provider]  rivastigmine (EXELON) 4.6 mg/24hr Place  4.6 mg onto the skin daily.    [provider]    Physical Exam: Constitutional: Moderately built and nourished. Vitals:   03/26/19 2043 03/27/19 0143 03/27/19 0308 03/27/19 0310  BP:  (!) 165/67 (!) 136/55 (!) 127/55  Pulse:  80 70   Resp:  18 18   Temp:  98 F (36.7 C) (!) 97.4 F (36.3 C) 97.8 F (36.6 C)  TempSrc:  Oral Oral Axillary  SpO2:  96% 94%   Weight: 72.6 kg     Height: 5\' 4"  (1.626 m)      Eyes: Anicteric no pallor. ENMT: No discharge from the ears eyes nose or mouth. Neck: No mass felt.  No neck rigidity. Respiratory: No rhonchi or crepitations. Cardiovascular: S1-S2  heard. Abdomen: Soft nontender bowel sound present. Musculoskeletal: No edema.  No joint effusion. Skin: No rash. Neurologic: Alert awake oriented to name moves all extremities. Psychiatric: Oriented to name.   Labs on Admission: I have personally reviewed following labs and imaging studies  CBC: Recent Labs  Lab 03/26/19 2242  WBC 7.6  NEUTROABS 5.1  HGB 13.1  HCT 40.9  MCV 93.8  PLT 235   Basic Metabolic Panel: Recent Labs  Lab 03/26/19 2242  NA 142  K 3.8  CL 107  CO2 24  GLUCOSE 95  BUN 24*  CREATININE 1.21*  CALCIUM 8.9   GFR: Estimated Creatinine Clearance: 34.4 mL/min (A) (by C-G formula based on SCr of 1.21 mg/dL (H)). Liver Function Tests: No results for input(s): AST, ALT, ALKPHOS, BILITOT, PROT, ALBUMIN in the last 168 hours. No results for input(s): LIPASE, AMYLASE in the last 168 hours. No results for input(s): AMMONIA in the last 168 hours. Coagulation Profile: No results for input(s): INR, PROTIME in the last 168 hours. Cardiac Enzymes: No results for input(s): CKTOTAL, CKMB, CKMBINDEX, TROPONINI in the last 168 hours. BNP (last 3 results) No results for input(s): PROBNP in the last 8760 hours. HbA1C: No results for input(s): HGBA1C in the last 72 hours. CBG: No results for input(s): GLUCAP in the last 168 hours. Lipid Profile: No results for input(s): CHOL, HDL, LDLCALC, TRIG, CHOLHDL, LDLDIRECT in the last 72 hours. Thyroid Function Tests: No results for input(s): TSH, T4TOTAL, FREET4, T3FREE, THYROIDAB in the last 72 hours. Anemia Panel: No results for input(s): VITAMINB12, FOLATE, FERRITIN, TIBC, IRON, RETICCTPCT in the last 72 hours. Urine analysis: No results found for: COLORURINE, APPEARANCEUR, LABSPEC, PHURINE, GLUCOSEU, HGBUR, BILIRUBINUR, KETONESUR, PROTEINUR, UROBILINOGEN, NITRITE, LEUKOCYTESUR Sepsis Labs: @LABRCNTIP (procalcitonin:4,lacticidven:4) )No results found for this or any previous visit (from the past 240 hour(s)).    Radiological Exams on Admission: CT Head Wo Contrast  Result Date: 03/26/2019 CLINICAL DATA:  Pain status post fall.  Right-sided hematoma. EXAM: CT HEAD WITHOUT CONTRAST CT CERVICAL SPINE WITHOUT CONTRAST TECHNIQUE: Multidetector CT imaging of the head and cervical spine was performed following the standard protocol without intravenous contrast. Multiplanar CT image reconstructions of the cervical spine were also generated. COMPARISON:  12/04/2018. FINDINGS: CT HEAD FINDINGS Brain: Findings is suspicious for a trace amount of extra-axial hemorrhage along the posterior right convexity (sagittal series 6, image 16). There is no other area of acute intracranial hemorrhage. There is volume loss with chronic microvascular ischemic changes. There is no significant midline shift. Vascular: No hyperdense vessel or unexpected calcification. Skull: There is posterior right scalp swelling without evidence for an underlying calvarial fracture. Sinuses/Orbits: No acute finding. Other: None. CT CERVICAL SPINE FINDINGS Alignment: Normal. Skull base and vertebrae: No acute fracture.  No primary bone lesion or focal pathologic process. Soft tissues and spinal canal: No prevertebral fluid or swelling. No visible canal hematoma. Disc levels: Multilevel degenerative changes are noted throughout the cervical spine, greatest at the C4-C5 and C5-C6 levels. Upper chest: Negative. Other: None IMPRESSION: 1. Trace extra-axial hemorrhage along the right occipital convexity as detailed above. No midline shift. 2. Right posterior scalp swelling with evidence for cephalhematoma. No underlying fracture. 3. No acute cervical spine fracture. 4. Age related volume loss and chronic microvascular ischemic changes are noted. These results were called by telephone at the time of interpretation on 03/26/2019 at 10:19 pm to provider [facility] , who verbally acknowledged these results. Electronically Signed   By: Katherine Mantle M.D.   On:  03/26/2019 22:23   CT Cervical Spine Wo Contrast  Result Date: 03/26/2019 CLINICAL DATA:  Pain status post fall.  Right-sided hematoma. EXAM: CT HEAD WITHOUT CONTRAST CT CERVICAL SPINE WITHOUT CONTRAST TECHNIQUE: Multidetector CT imaging of the head and cervical spine was performed following the standard protocol without intravenous contrast. Multiplanar CT image reconstructions of the cervical spine were also generated. COMPARISON:  12/04/2018. FINDINGS: CT HEAD FINDINGS Brain: Findings is suspicious for a trace amount of extra-axial hemorrhage along the posterior right convexity (sagittal series 6, image 16). There is no other area of acute intracranial hemorrhage. There is volume loss with chronic microvascular ischemic changes. There is no significant midline shift. Vascular: No hyperdense vessel or unexpected calcification. Skull: There is posterior right scalp swelling without evidence for an underlying calvarial fracture. Sinuses/Orbits: No acute finding. Other: None. CT CERVICAL SPINE FINDINGS Alignment: Normal. Skull base and vertebrae: No acute fracture. No primary bone lesion or focal pathologic process. Soft tissues and spinal canal: No prevertebral fluid or swelling. No visible canal hematoma. Disc levels: Multilevel degenerative changes are noted throughout the cervical spine, greatest at the C4-C5 and C5-C6 levels. Upper chest: Negative. Other: None IMPRESSION: 1. Trace extra-axial hemorrhage along the right occipital convexity as detailed above. No midline shift. 2. Right posterior scalp swelling with evidence for cephalhematoma. No underlying fracture. 3. No acute cervical spine fracture. 4. Age related volume loss and chronic microvascular ischemic changes are noted. These results were called by telephone at the time of interpretation on 03/26/2019 at 10:19 pm to provider [facility] , who verbally acknowledged these results. Electronically Signed   By: Katherine Mantle M.D.   On: 03/26/2019  22:23    EKG: Independently reviewed.  Normal sinus rhythm.  Assessment/Plan Principal Problem:   Traumatic intracranial hemorrhage without loss of consciousness (HCC) Active Problems:   Unwitnessed fall   Essential hypertension   DNR (do not resuscitate)   Dementia (HCC)   Intracranial bleed (HCC)    1. Traumatic intracranial hemorrhage with no loss of consciousness -ER physician had discussed with Dr. Benna Dunks on-call neurosurgeon who has requested repeat CT head in the morning.  We will hold off patient's aspirin.  Closely observe.  Will get physical therapy consult. 2. Hypertension on amlodipine and Cozaar.  We will keep patient on as needed IV hydralazine for systolic blood pressure more than 160. 3. Chronic kidney disease stage III creatinine appears to be at baseline when compared to the one available in care everywhere in January of this year.  Note that patient is on Cozaar.  If any worsening of creatinine will have to hold Cozaar. 4. History of dementia on Exelon. 5. History of depression on Wellbutrin and Zoloft.  Takes as needed Xanax for anxiety.  6. Previous history of stroke presently holding aspirin due to intracranial bleed.  Covid test is pending.   DVT prophylaxis: SCDs will avoid anticoagulation due to intracranial bleed. Code Status: DNR confirmed with patient's daughter. Family Communication: Patient's daughter. Disposition Plan: To be determined. Consults called: ER physician discussed with neurosurgery. Admission status: Observation.   Eduard Clos MD [facility] Pager [phone].  If 7PM-7AM, please contact night-coverage [email or web address] Password [email or web address]  03/27/2019, 3:35 AM

## 2019-03-27 NOTE — Plan of Care (Signed)

## 2020-10-31 ENCOUNTER — Encounter (HOSPITAL_COMMUNITY): Payer: Self-pay | Admitting: Emergency Medicine

## 2020-10-31 ENCOUNTER — Emergency Department (HOSPITAL_COMMUNITY): Payer: Medicare Other

## 2020-10-31 ENCOUNTER — Other Ambulatory Visit: Payer: Self-pay

## 2020-10-31 ENCOUNTER — Observation Stay (HOSPITAL_COMMUNITY)
Admission: EM | Admit: 2020-10-31 | Discharge: 2020-11-01 | Disposition: A | Discharge status: Home / Self Care | Payer: Medicare Other | Attending: Family Medicine | Admitting: Family Medicine

## 2020-10-31 DIAGNOSIS — Z7982 Long term (current) use of aspirin: Secondary | ICD-10-CM | POA: Diagnosis not present

## 2020-10-31 DIAGNOSIS — G309 Alzheimer's disease, unspecified: Secondary | ICD-10-CM | POA: Insufficient documentation

## 2020-10-31 DIAGNOSIS — R319 Hematuria, unspecified: Secondary | ICD-10-CM

## 2020-10-31 DIAGNOSIS — Z20822 Contact with and (suspected) exposure to covid-19: Secondary | ICD-10-CM | POA: Insufficient documentation

## 2020-10-31 DIAGNOSIS — I10 Essential (primary) hypertension: Secondary | ICD-10-CM | POA: Insufficient documentation

## 2020-10-31 DIAGNOSIS — B9620 Unspecified Escherichia coli [E. coli] as the cause of diseases classified elsewhere: Secondary | ICD-10-CM | POA: Insufficient documentation

## 2020-10-31 DIAGNOSIS — R2681 Unsteadiness on feet: Secondary | ICD-10-CM | POA: Diagnosis not present

## 2020-10-31 DIAGNOSIS — N390 Urinary tract infection, site not specified: Secondary | ICD-10-CM | POA: Diagnosis not present

## 2020-10-31 DIAGNOSIS — N3281 Overactive bladder: Secondary | ICD-10-CM | POA: Diagnosis not present

## 2020-10-31 DIAGNOSIS — F0280 Dementia in other diseases classified elsewhere without behavioral disturbance: Secondary | ICD-10-CM | POA: Insufficient documentation

## 2020-10-31 DIAGNOSIS — R531 Weakness: Secondary | ICD-10-CM | POA: Diagnosis present

## 2020-10-31 DIAGNOSIS — Z8673 Personal history of transient ischemic attack (TIA), and cerebral infarction without residual deficits: Secondary | ICD-10-CM | POA: Insufficient documentation

## 2020-10-31 DIAGNOSIS — R944 Abnormal results of kidney function studies: Secondary | ICD-10-CM | POA: Insufficient documentation

## 2020-10-31 DIAGNOSIS — R2990 Unspecified symptoms and signs involving the nervous system: Secondary | ICD-10-CM | POA: Diagnosis not present

## 2020-10-31 DIAGNOSIS — Y900 Blood alcohol level of less than 20 mg/100 ml: Secondary | ICD-10-CM | POA: Insufficient documentation

## 2020-10-31 DIAGNOSIS — Z79899 Other long term (current) drug therapy: Secondary | ICD-10-CM | POA: Diagnosis not present

## 2020-10-31 DIAGNOSIS — R29810 Facial weakness: Secondary | ICD-10-CM | POA: Diagnosis present

## 2020-10-31 LAB — I-STAT CHEM 8, ED
BUN: 27 mg/dL — ABNORMAL HIGH (ref 8–23)
Calcium, Ion: 1.12 mmol/L — ABNORMAL LOW (ref 1.15–1.40)
Chloride: 107 mmol/L (ref 98–111)
Creatinine, Ser: 1.2 mg/dL — ABNORMAL HIGH (ref 0.44–1.00)
Glucose, Bld: 127 mg/dL — ABNORMAL HIGH (ref 70–99)
HCT: 41 % (ref 36.0–46.0)
Hemoglobin: 13.9 g/dL (ref 12.0–15.0)
Potassium: 4.1 mmol/L (ref 3.5–5.1)
Sodium: 141 mmol/L (ref 135–145)
TCO2: 22 mmol/L (ref 22–32)

## 2020-10-31 LAB — PROTIME-INR
INR: 1 (ref 0.8–1.2)
Prothrombin Time: 13.2 seconds (ref 11.4–15.2)

## 2020-10-31 LAB — COMPREHENSIVE METABOLIC PANEL
ALT: 14 U/L (ref 0–44)
AST: 16 U/L (ref 15–41)
Albumin: 3.3 g/dL — ABNORMAL LOW (ref 3.5–5.0)
Alkaline Phosphatase: 60 U/L (ref 38–126)
Anion gap: 10 (ref 5–15)
BUN: 23 mg/dL (ref 8–23)
CO2: 21 mmol/L — ABNORMAL LOW (ref 22–32)
Calcium: 9.1 mg/dL (ref 8.9–10.3)
Chloride: 109 mmol/L (ref 98–111)
Creatinine, Ser: 1.23 mg/dL — ABNORMAL HIGH (ref 0.44–1.00)
GFR, Estimated: 43 mL/min — ABNORMAL LOW (ref >60)
Glucose, Bld: 136 mg/dL — ABNORMAL HIGH (ref 70–99)
Potassium: 4.1 mmol/L (ref 3.5–5.1)
Sodium: 140 mmol/L (ref 135–145)
Total Bilirubin: 0.5 mg/dL (ref 0.3–1.2)
Total Protein: 6.5 g/dL (ref 6.5–8.1)

## 2020-10-31 LAB — RAPID URINE DRUG SCREEN, HOSP PERFORMED
Amphetamines: NOT DETECTED
Barbiturates: NOT DETECTED
Benzodiazepines: POSITIVE — AB
Cocaine: NOT DETECTED
Opiates: NOT DETECTED
Tetrahydrocannabinol: NOT DETECTED

## 2020-10-31 LAB — CBC
HCT: 44.7 % (ref 36.0–46.0)
Hemoglobin: 14.2 g/dL (ref 12.0–15.0)
MCH: 31.1 pg (ref 26.0–34.0)
MCHC: 31.8 g/dL (ref 30.0–36.0)
MCV: 97.8 fL (ref 80.0–100.0)
Platelets: 283 10*3/uL (ref 150–400)
RBC: 4.57 MIL/uL (ref 3.87–5.11)
RDW: 12.5 % (ref 11.5–15.5)
WBC: 8 10*3/uL (ref 4.0–10.5)
nRBC: 0 % (ref 0.0–0.2)

## 2020-10-31 LAB — URINALYSIS, ROUTINE W REFLEX MICROSCOPIC
Bilirubin Urine: NEGATIVE
Glucose, UA: NEGATIVE mg/dL
Hgb urine dipstick: NEGATIVE
Ketones, ur: NEGATIVE mg/dL
Nitrite: POSITIVE — AB
Protein, ur: NEGATIVE mg/dL
Specific Gravity, Urine: 1.02 (ref 1.005–1.030)
pH: 6 (ref 5.0–8.0)

## 2020-10-31 LAB — CBG MONITORING, ED: Glucose-Capillary: 131 mg/dL — ABNORMAL HIGH (ref 70–99)

## 2020-10-31 LAB — DIFFERENTIAL
Abs Immature Granulocytes: 0.02 10*3/uL (ref 0.00–0.07)
Basophils Absolute: 0.1 10*3/uL (ref 0.0–0.1)
Basophils Relative: 1 %
Eosinophils Absolute: 0.1 10*3/uL (ref 0.0–0.5)
Eosinophils Relative: 2 %
Immature Granulocytes: 0 %
Lymphocytes Relative: 37 %
Lymphs Abs: 3 10*3/uL (ref 0.7–4.0)
Monocytes Absolute: 0.6 10*3/uL (ref 0.1–1.0)
Monocytes Relative: 8 %
Neutro Abs: 4.2 10*3/uL (ref 1.7–7.7)
Neutrophils Relative %: 52 %

## 2020-10-31 LAB — ETHANOL: Alcohol, Ethyl (B): <10 mg/dL

## 2020-10-31 LAB — APTT: aPTT: 24 seconds (ref 24–36)

## 2020-10-31 LAB — URINALYSIS, MICROSCOPIC (REFLEX)

## 2020-10-31 LAB — RESP PANEL BY RT-PCR (FLU A&B, COVID) ARPGX2
Influenza A by PCR: NEGATIVE
Influenza B by PCR: NEGATIVE
SARS Coronavirus 2 by RT PCR: NEGATIVE

## 2020-10-31 MED ORDER — FOSFOMYCIN TROMETHAMINE 3 G PO PACK: 3.0000 g | PACK | Freq: Once | ORAL | Status: DC

## 2020-10-31 MED ORDER — NITROFURANTOIN MONOHYD MACRO 100 MG PO CAPS
100.0000 mg | ORAL_CAPSULE | Freq: Once | ORAL | Status: AC
  Administered 2020-10-31: 100 mg via ORAL
  Filled 2020-10-31: qty 1

## 2020-10-31 MED ORDER — DONEPEZIL HCL 5 MG PO TABS
5.0000 mg | ORAL_TABLET | Freq: Every day | ORAL | Status: DC
  Administered 2020-10-31: 5 mg via ORAL
  Filled 2020-10-31 (×2): qty 1

## 2020-10-31 MED ORDER — ALPRAZOLAM 0.25 MG PO TABS
0.2500 mg | ORAL_TABLET | Freq: Every day | ORAL | Status: DC
  Administered 2020-10-31: 0.25 mg via ORAL
  Filled 2020-10-31: qty 1

## 2020-10-31 MED ORDER — MIRABEGRON ER 25 MG PO TB24
25.0000 mg | ORAL_TABLET | Freq: Every day | ORAL | Status: DC
  Administered 2020-11-01: 25 mg via ORAL
  Filled 2020-10-31: qty 1

## 2020-10-31 MED ORDER — ENOXAPARIN SODIUM 40 MG/0.4ML IJ SOSY
40.0000 mg | PREFILLED_SYRINGE | Freq: Every day | INTRAMUSCULAR | Status: DC
  Administered 2020-11-01: 40 mg via SUBCUTANEOUS
  Filled 2020-10-31: qty 0.4

## 2020-10-31 MED ORDER — ASPIRIN EC 81 MG PO TBEC
81.0000 mg | DELAYED_RELEASE_TABLET | Freq: Every day | ORAL | Status: DC
  Administered 2020-11-01: 81 mg via ORAL
  Filled 2020-10-31: qty 1

## 2020-10-31 MED ORDER — NITROFURANTOIN MONOHYD MACRO 100 MG PO CAPS: 100.0000 mg | ORAL_CAPSULE | Freq: Two times a day (BID) | ORAL | Status: DC

## 2020-10-31 MED ORDER — FOSFOMYCIN TROMETHAMINE 3 G PO PACK
3.0000 g | PACK | Freq: Once | ORAL | Status: AC
  Administered 2020-11-01: 3 g via ORAL
  Filled 2020-10-31: qty 3

## 2020-10-31 MED ORDER — MELATONIN 5 MG PO TABS
10.0000 mg | ORAL_TABLET | Freq: Every day | ORAL | Status: DC
  Administered 2020-10-31: 10 mg via ORAL
  Filled 2020-10-31: qty 2

## 2020-10-31 NOTE — Consult Note (Signed)
Neurology Consultation  Reason for Consult: Code stroke, left-sided weakness Referring Physician: M. Renaye Rakers, MD  CC: Left-sided weakness  History is obtained from: Chart review, EMS  HPI: Heather Carson is a 87 y.o. female past medical history of Alzheimer's, hypertension, hyperlipidemia, recurrent falls, bilateral leg weakness using a walker to walk, was in usual state of health last known well at 11:45 AM and around 2 PM, when family came and they found her to have some left-sided weakness and left facial droop.  The patient lives at a facility and family helps with ADLs.  Her modified Rankin score is at least 3-4. EMS was called, they noted some subtle left-sided weakness and along with a left facial droop for which a code stroke was called. The patient is also being treated for a UTI-has had recurrent UTIs. Also has had tremulousness of the whole body since his last UTI. Tremulousness is not something new that started today but has been going on now for few days. No headache or visual changes.  No shortness of breath or chest pain.  Nausea vomiting.   LKW: 11:45 AM today tpa given?: no, outside the window Premorbid modified Rankin scale (mRS): 3-4  ROS: Full ROS was performed and is negative except as noted in the HPI.   Past Medical History:  Diagnosis Date   Alzheimer disease (HCC)    HLD (hyperlipidemia)    Hypertension    Incontinence    Recurrent falls    Stroke (HCC)    Weakness of both legs      Family History  Family history unknown: Yes     Social History:   reports that she has never smoked. She has never used smokeless tobacco. She reports that she does not drink alcohol and does not use drugs.  Medications No current facility-administered medications for this encounter.  Current Outpatient Medications:    ALPRAZolam (XANAX) 0.5 MG tablet, Take 0.5 mg by mouth at bedtime., Disp: , Rfl:    amLODipine (NORVASC) 5 MG tablet, Take 5 mg by mouth daily., Disp:  , Rfl:    aspirin EC 81 MG tablet, Take 1 tablet (81 mg total) by mouth daily., Disp:  , Rfl:    buPROPion (WELLBUTRIN XL) 150 MG 24 hr tablet, Take 150 mg by mouth daily., Disp: , Rfl:    losartan (COZAAR) 100 MG tablet, Take 100 mg by mouth daily., Disp: , Rfl:    meclizine (ANTIVERT) 12.5 MG tablet, Take 12.5 mg by mouth at bedtime as needed for dizziness., Disp: , Rfl:    Melatonin 10 MG SUBL, Place 10 mg under the tongue at bedtime., Disp: , Rfl:    rivastigmine (EXELON) 4.6 mg/24hr, Place 4.6 mg onto the skin daily., Disp: , Rfl:    sertraline (ZOLOFT) 100 MG tablet, Take 200 mg by mouth daily., Disp: , Rfl:    Exam: Current vital signs: BP (!) 147/72 (BP Location: Right Arm)   Pulse 92   Temp 98 F (36.7 C) (Temporal)   Resp 18   SpO2 100%  Vital signs in last 24 hours: Temp:  [98 F (36.7 C)] 98 F (36.7 C) (09/11 2010) Pulse Rate:  [92] 92 (09/11 2010) Resp:  [18] 18 (09/11 2010) BP: (147)/(72) 147/72 (09/11 2010) SpO2:  [100 %] 100 % (09/11 2010) General: Awake alert in no distress HEENT: Normocephalic/atraumatic Lungs: Clear Cardiovascular: Regular rate rhythm Abdomen nondistended nontender Extremities warm well perfused Neurological exam Awake alert oriented to self Got the date and  month wrong Does not know where she is at this time Speech is mildly dysarthric with some vocal tremor No evidence of aphasia Diminished attention concentration Cranial nerves: Pupils equal round react light, extraocular movements intact, visual fields full, facial sensation intact, left nasolabial fold subtle flattening at rest, smile is symmetric, tongue and palate midline. Motor examination with antigravity strength in all 4 extremities with significant rest and action tremor worse on the right side. Sensation intact light touch without extinction Coordination: Tremor as noted above, no obvious dysmetria Gait testing deferred NIH stroke scale on arrival at 2000  hrs.-4    Labs I have reviewed labs in epic and the results pertinent to this consultation are:   CBC    Component Value Date/Time   WBC 8.0 10/31/2020 2002   RBC 4.57 10/31/2020 2002   HGB 13.9 10/31/2020 2008   HCT 41.0 10/31/2020 2008   PLT 283 10/31/2020 2002   MCV 97.8 10/31/2020 2002   MCH 31.1 10/31/2020 2002   MCHC 31.8 10/31/2020 2002   RDW 12.5 10/31/2020 2002   LYMPHSABS 3.0 10/31/2020 2002   MONOABS 0.6 10/31/2020 2002   EOSABS 0.1 10/31/2020 2002   BASOSABS 0.1 10/31/2020 2002    CMP     Component Value Date/Time   NA 141 10/31/2020 2008   K 4.1 10/31/2020 2008   CL 107 10/31/2020 2008   CO2 27 03/27/2019 0604   GLUCOSE 127 (H) 10/31/2020 2008   BUN 27 (H) 10/31/2020 2008   CREATININE 1.20 (H) 10/31/2020 2008   CALCIUM 9.0 03/27/2019 0604   GFRNONAA 51 (L) 03/27/2019 0604   GFRAA 60 (L) 03/27/2019 0604    Imaging I have reviewed the images obtained:  CT-head-no acute changes.  Aspects 10  Assessment:  87 year old past history of Alzheimer's, hypertension, hyperlipidemia, recurrent falls, bilateral leg weakness, brought in for evaluation of left-sided weakness. On my examination she has a subtle left nasolabial fold flattening as well as is not oriented to place or time-which is baseline. Her NIH stroke scale was 4-symptoms not consistent with LVO. Noncontrast head CT unremarkable for acute process. Outside the window for IV tPA/TNKase Not a candidate for IR for multiple reasons-symptoms not consistent with LVO, poor baseline modified Rankin score. Given somewhat deranged renal function, held off emergent head and neck vessel imaging. Symptoms likely consistent with toxic metabolic encephalopathy versus a small stroke.  Recommendations: May benefit for observation for completion of work-up Check complete set of labs Check urinalysis Check chest x-ray MRI brain without contrast Stroke work-up only if MRI is positive for stroke otherwise  correction of toxic metabolic derangements and treatment per primary team. Neurology will follow with you.  Plan discussed with the ED provider in person.  -- Milon Dikes, MD Neurologist [facility] Pager: [phone]

## 2020-10-31 NOTE — Code Documentation (Signed)
Stroke Response Nurse Documentation Code Documentation  MAISON AGRUSA is a 87 y.o. female arriving to [facility] ED via [facility] on 9/11 with past medical hx of HTN, dementia, fall. On No antithrombotic. Code stroke was activated by EMS.   Patient from home where she was LKW at 1145 and now complaining of slurred speech and Left facial droop. Stroke team at the bedside on patient arrival. Labs drawn and patient cleared for CT by Dr. Renaye Rakers. Patient to CT with team. NIHSS 4, see documentation for details and code stroke times. Patient with disoriented, left facial droop, and dysarthria  on exam. The following imaging was completed:  CT, MRI/MRA. Patient is not a candidate for IV Thrombolytic due to Out of window. Patient is not a candidate for IR due to No LVO.    Bedside handoff with ED RN Luther Parody.    Rose Fillers  Rapid Response RN

## 2020-10-31 NOTE — H&P (Addendum)
Family Medicine Teaching [facility] Admission History and Physical Service Pager: [phone]  Patient name: Heather Carson Medical record number: [id number] Date of birth: Jan 23, 1934 Age: 87 y.o. Gender: female  Primary Care Provider: Leola Brazil, DO Consultants: Neuro Code Status: DNR-confirmed with patient and her daughter/HCPOA Preferred Emergency Contact: Donald Pore - daughter Avon Gully [phone]  Chief Complaint: L facial droop and dysarthria  Assessment and Plan: Heather Carson is a 87 y.o. female presenting with L facial droop and dysarthria. PMH is significant for Alzheimer's dementia, prior CVAs, HTN, recurrent UTIs with MDR organism,   AMS (now resolved) with reported focal neuro deficits  Patient with Alzheimer's dementia, normally lives at independent living but able to take care of basic ADLs with the help of part-time caretaker. Reportedly, patient has a history of UTIs with MDR E.Coli.  Per her daughter and [facility] POA when she gets a UTI, patient will become confused and develop weakness.  Patient developed left side facial droop and left arm weakness earlier today per the daughter.  Upon arrival to the ED code stroke called for patient given report of new L arm weakness and facial droop.  No focal deficit seen objectively by ED physician on arrival. CT head showing no acute stroke. On our assessment patient has no focal neurological deficit except for slight facial asymmetry and is fully alert and oriented to place but not year.    Neurology recommended following up with MRI brain.  CTA not obtained given elevated Cr.  Overall etiology for patient's symptoms can include CVA versus TIA.  No significant electrolyte derangements noted on initial labs. -Admit to progressive, attending Dr. Pollie Meyer -Neuro on board, appreciate recommendations -MRI-Brain, if positive will pursue stroke workup per neurology -Cardiac monitoring -Bedside swallow passed, regular diet -Frequent  neurochecks -PT/OT evaluate and treat -We will allow permissive hypertension at least until the MRI results  UTI: Patient with a history of frequent UTIs.  Per patient's daughter patient was awaiting a urine culture to return to determine antibiotic therapy for a UTI that she has been experiencing.  Her symptoms include dysuria.  Analysis at our emergency department shows small leukocyte Estrace, positive nitrite, many bacteria.  Urine culture pending however recent urine culture shows susceptibility to Macrobid and gentamicin.  Due to patient's age and renal function pharmacy recommended using fosfomycin as this should be susceptible.  No significant suprapubic discomfort on palpation.  Negative CVA tenderness. -Fosfomycin 3 g one-time followed by another dose of 3 g in 3 days per pharmacy -Urine culture pending  Benzodiazepine use Daughter reports regular QHS dosing of Xanax 0.5 mg for years and recent decrease to 0.25 mg a few months prior. Spoke with pharmacy, said that withdrawal is still a concern even with this small of a dose.  Per daughter patient takes this for sleep so that she does not risk a fall at night. -Continue home Xanax, 0.25 mg QHS -Continue home melatonin 10 mg for sleep -Recommend tapering Xanax at discharge with plan to eventually discontinue  Elevated Cr On previous admission 1 yr ago , patient with Cr ranging from 1.01-1.21. Currently 1.23. -Encourage PO fluids -Recheck BMP in the a.m. -Consider IV fluids if creatinine is not improving with oral hydration in the morning  Alzheimer's dementia Mental and functional status as above -Continue home donepezil 5 mg  Overactive Bladder Has history of a stimulator in her low back for overactive bladder which was turned off during a recent work-up and per daughter has  not been turned back on.  Takes Myrbetriq at home. -Continue Mybetriq 25 mg  FEN/GI: regular diet Prophylaxis: Lovenox  Disposition: likely  home  History of Present Illness:  Heather Carson is a 87 y.o. female presenting with left sided facial droop and weakness of left arm and hand. Her daughter states she woke up with drooling on the left side that worsened throughout the day. Daughter noticed a left sided facial droop. Daughter states she has had some left sided tremors in her upper limb for several days or a week. She has also been dealing with a UTI and gets these sometimes 1-2x a month. Daughter says she has had 20-30 of these this year. Macrobid helped with the last one. Daughter states she was referred to ID after the last urine culture due to the culture being resistant to most things.   She has a caregiver that helps with ADLs. Daughter and caregiver help with her meds. Daughter says she is incontenient at baseline. She started complaining of low back pain this week which daughter says is not normal for her.   She takes 0.25 (1/2 0.5mg  tablet) nightly. She has been on xanax for years but for 3 months she has been on 0.25mg  nightly. Per daughter she had been taking it to help her get to sleep since she lives alone in independent living. Daugther   Alcohol: None Tobacco: Never Drugs: None  Review Of Systems: Per HPI with the following additions:   Review of Systems  Constitutional:  Negative for fever and unexpected weight change.  Respiratory:  Negative for chest tightness and shortness of breath.   Cardiovascular:  Negative for chest pain.  Gastrointestinal:  Positive for constipation and diarrhea. Negative for abdominal pain, nausea and vomiting.  Genitourinary:  Positive for dysuria.  Musculoskeletal:  Positive for back pain.  Neurological:  Negative for headaches.    Patient Active Problem List   Diagnosis Date Noted   Unwitnessed fall 03/27/2019   Essential hypertension 03/27/2019   DNR (do not resuscitate) 03/27/2019   Dementia (HCC) 03/27/2019   Intracranial bleed (HCC) 03/27/2019   Traumatic intracranial  hemorrhage without loss of consciousness (HCC) 03/26/2019    Past Medical History: Past Medical History:  Diagnosis Date   Alzheimer disease (HCC)    HLD (hyperlipidemia)    Hypertension    Incontinence    Recurrent falls    Stroke (HCC)    Weakness of both legs     Past Surgical History: History reviewed. No pertinent surgical history.  Social History: Social History   Tobacco Use   Smoking status: Never   Smokeless tobacco: Never  Vaping Use   Vaping Use: Never used  Substance Use Topics   Alcohol use: Never   Drug use: Never   Additional social history: no alcohol, tobacco, or illicit drugs Please also refer to relevant sections of EMR.  Family History: Family History  Family history unknown: Yes   Brother with stroke and alzehimers. Other brother had parkensons   Allergies and Medications: Allergies  Allergen Reactions   Atorvastatin Other (See Comments)    Hip pain which resolved with cessation of the atorvastatin Hip pain which resolved with cessation of the atorvastatin Hip pain which resolved with cessation of the atorvastatin Hip pain which resolved with cessation of the atorvastatin    Codeine Nausea And Vomiting   Other Rash   Sulfa Antibiotics Rash   No current facility-administered medications on file prior to encounter.   Current Outpatient  Medications on File Prior to Encounter  Medication Sig Dispense Refill   ALPRAZolam (XANAX) 0.5 MG tablet Take 0.5 mg by mouth at bedtime.     amLODipine (NORVASC) 5 MG tablet Take 5 mg by mouth daily.     aspirin EC 81 MG tablet Take 1 tablet (81 mg total) by mouth daily.     buPROPion (WELLBUTRIN XL) 150 MG 24 hr tablet Take 150 mg by mouth daily.     losartan (COZAAR) 100 MG tablet Take 100 mg by mouth daily.     meclizine (ANTIVERT) 12.5 MG tablet Take 12.5 mg by mouth at bedtime as needed for dizziness.     Melatonin 10 MG SUBL Place 10 mg under the tongue at bedtime.     rivastigmine (EXELON) 4.6  mg/24hr Place 4.6 mg onto the skin daily.     sertraline (ZOLOFT) 100 MG tablet Take 200 mg by mouth daily.      Objective: BP (!) 187/56   Pulse 80   Temp 98 F (36.7 C) (Temporal)   Resp 18   Ht 5\' 4"  (1.626 m)   Wt 72.6 kg   SpO2 100%   BMI 27.47 kg/m  Physical Exam Vitals reviewed.  Cardiovascular:     Rate and Rhythm: Normal rate and regular rhythm.     Pulses: Normal pulses.     Heart sounds: No murmur heard. Pulmonary:     Effort: Pulmonary effort is normal.     Breath sounds: Normal breath sounds.  Abdominal:     General: Bowel sounds are normal.     Palpations: Abdomen is soft.     Tenderness: There is no abdominal tenderness.  Neurological:     General: No focal deficit present.     Mental Status: She is alert.     Comments: Alert and oriented to self, month, location, thinks the year is 1947.     Labs and Imaging: CBC BMET  Recent Labs  Lab 10/31/20 2002 10/31/20 2008  WBC 8.0  --   HGB 14.2 13.9  HCT 44.7 41.0  PLT 283  --    Recent Labs  Lab 10/31/20 2002 10/31/20 2008  NA 140 141  K 4.1 4.1  CL 109 107  CO2 21*  --   BUN 23 27*  CREATININE 1.23* 1.20*  GLUCOSE 136* 127*  CALCIUM 9.1  --      EKG: NSR, no ST changes noted  12/31/20 PGY-1, Psychiatry FPTS Intern pager: [phone], text pages welcome  Upper Level Addendum:  I have seen and evaluated this patient along with Dr. [phone] and reviewed the above note, making necessary revisions as appropriate.  I agree with the medical decision making and physical exam as noted above.  Jerrel Ivory, DO PGY-3 [facility]

## 2020-10-31 NOTE — ED Provider Notes (Signed)
[facility] EMERGENCY DEPARTMENT Provider Note   CSN: [id number] Arrival date & time: 10/31/20  1958  An emergency department physician performed an initial assessment on this suspected stroke patient at 2002.  History CC:  Weakness   Heather Carson is a 87 y.o. female with a history of Alzheimer's disease, prior strokes, hypertension, recurrent UTIs, presenting from an independent living facility with concern for left-sided weakness and facial droop.  Last seen well at 1145, noted by daughter around 2:00 to have these deficits.  The patient lives in independent assisted living and has a caretaker comes twice today to check in on her, and her daughter gives her her medications.  The patient does have worsening Alzheimer's dementia but is still able to function and live by herself.  The patient walks with the assistance of a Rollator.  Her daughter reports the patient had a small stroke several months ago, but had no lasting neurodeficits for that.  The patient does take Megace for appetite stimulation has been eating and drinking appropriately.  Her daughter reports that the patient has a history of recurring UTIs, which often lead to confusion.  She had UA consistent with a urine infection 5 or 6 days ago, but her PCP was waiting on the urine culture results prior to initiating antibiotics.  The patient is not currently on antibiotics.  Medical records show that she had a UA results on September 6 which was showing signs of positive nitrites, 3+ leukocytes, 2+ blood.  There are no visible culture results from September.  The patient does have a positive E. coli result from June which is multidrug-resistant E. coli.  Urine Culture,Comprehensive   Final report Abnormal     Result 1  Escherichia coli Abnormal   Multi-Drug Resistant Organism  Susceptibility profile is consistent with a probable ESBL.  Greater than 100,000 colony forming units per mL  Antimicrobial Susceptibility   Comment        ** S = Susceptible; I = Intermediate; R = Resistant **                     P = Positive; N = Negative              MICS are expressed in micrograms per mL     Antibiotic                 RSLT#1    RSLT#2    RSLT#3    RSLT#4  Amoxicillin/Clavulanic Acid    S  Ampicillin                     R  Cefazolin                      R  Cefepime                       R  Ceftriaxone                    R  Cefuroxime                     R  Ciprofloxacin                  R  Ertapenem                      S  Gentamicin  S  Imipenem                       S  Levofloxacin                   R  Meropenem                      S  Nitrofurantoin                 S  Piperacillin/Tazobactam        S  Tetracycline                   R  Tobramycin                     S  Trimethoprim/Sulfa             R     HPI     Past Medical History:  Diagnosis Date   Alzheimer disease (HCC)    HLD (hyperlipidemia)    Hypertension    Incontinence    Recurrent falls    Stroke (HCC)    Weakness of both legs     Patient Active Problem List   Diagnosis Date Noted   Unwitnessed fall 03/27/2019   Essential hypertension 03/27/2019   DNR (do not resuscitate) 03/27/2019   Dementia (HCC) 03/27/2019   Intracranial bleed (HCC) 03/27/2019   Traumatic intracranial hemorrhage without loss of consciousness (HCC) 03/26/2019    History reviewed. No pertinent surgical history.   OB History   No obstetric history on file.     Family History  Family history unknown: Yes    Social History   Tobacco Use   Smoking status: Never   Smokeless tobacco: Never  Vaping Use   Vaping Use: Never used  Substance Use Topics   Alcohol use: Never   Drug use: Never    Home Medications Prior to Admission medications   Medication Sig Start Date End Date Taking? Authorizing Provider  ALPRAZolam Prudy Feeler) 0.5 MG tablet Take 0.5 mg by mouth at bedtime.    [provider]  amLODipine  (NORVASC) 5 MG tablet Take 5 mg by mouth daily.    [provider]  aspirin EC 81 MG tablet Take 1 tablet (81 mg total) by mouth daily. 03/31/19   Hughie Closs, MD  buPROPion (WELLBUTRIN XL) 150 MG 24 hr tablet Take 150 mg by mouth daily.    [provider]  losartan (COZAAR) 100 MG tablet Take 100 mg by mouth daily.    [provider]  meclizine (ANTIVERT) 12.5 MG tablet Take 12.5 mg by mouth at bedtime as needed for dizziness.    [provider]  Melatonin 10 MG SUBL Place 10 mg under the tongue at bedtime.    [provider]  rivastigmine (EXELON) 4.6 mg/24hr Place 4.6 mg onto the skin daily.    [provider]  sertraline (ZOLOFT) 100 MG tablet Take 200 mg by mouth daily.    [provider]    Allergies    Atorvastatin, Codeine, Other, and Sulfa antibiotics  Review of Systems   Review of Systems  Constitutional:  Negative for chills and fever.  Eyes:  Negative for pain and visual disturbance.  Respiratory:  Negative for cough and shortness of breath.   Cardiovascular:  Negative for chest pain and palpitations.  Gastrointestinal:  Negative for abdominal pain and vomiting.  Musculoskeletal:  Negative for arthralgias and back pain.  Skin:  Negative for color change and rash.  Neurological:  Negative for syncope and headaches.  All other systems reviewed and are negative.  Physical Exam Updated Vital Signs BP (!) 164/83   Pulse 85   Temp 98 F (36.7 C) (Temporal)   Resp 20   Ht 5\' 4"  (1.626 m)   Wt 72.6 kg   SpO2 99%   BMI 27.47 kg/m   Physical Exam Constitutional:      General: She is not in acute distress. HENT:     Head: Normocephalic and atraumatic.  Eyes:     Conjunctiva/sclera: Conjunctivae normal.     Pupils: Pupils are equal, round, and reactive to light.  Cardiovascular:     Rate and Rhythm: Normal rate and regular rhythm.  Pulmonary:     Effort: Pulmonary effort is normal. No respiratory distress.   Abdominal:     General: There is no distension.     Tenderness: There is no abdominal tenderness.  Skin:    General: Skin is warm and dry.  Neurological:     General: No focal deficit present.     Mental Status: She is alert. Mental status is at baseline.     Sensory: No sensory deficit.     Motor: No weakness.    ED Results / Procedures / Treatments   Labs (all labs ordered are listed, but only abnormal results are displayed) Labs Reviewed  COMPREHENSIVE METABOLIC PANEL - Abnormal; Notable for the following components:      Result Value   CO2 21 (*)    Glucose, Bld 136 (*)    Creatinine, Ser 1.23 (*)    Albumin 3.3 (*)    GFR, Estimated 43 (*)    All other components within normal limits  RAPID URINE DRUG SCREEN, HOSP PERFORMED - Abnormal; Notable for the following components:   Benzodiazepines POSITIVE (*)    All other components within normal limits  URINALYSIS, ROUTINE W REFLEX MICROSCOPIC - Abnormal; Notable for the following components:   Nitrite POSITIVE (*)    Leukocytes,Ua SMALL (*)    All other components within normal limits  URINALYSIS, MICROSCOPIC (REFLEX) - Abnormal; Notable for the following components:   Bacteria, UA MANY (*)    All other components within normal limits  I-STAT CHEM 8, ED - Abnormal; Notable for the following components:   BUN 27 (*)    Creatinine, Ser 1.20 (*)    Glucose, Bld 127 (*)    Calcium, Ion 1.12 (*)    All other components within normal limits  CBG MONITORING, ED - Abnormal; Notable for the following components:   Glucose-Capillary 131 (*)    All other components within normal limits  RESP PANEL BY RT-PCR (FLU A&B, COVID) [id number]  URINE CULTURE  ETHANOL  PROTIME-INR  APTT  CBC  DIFFERENTIAL    EKG EKG Interpretation  Date/Time:  Sunday October 31 2020 20:24:30 EDT Ventricular Rate:  87 PR Interval:  153 QRS Duration: 95 QT Interval:  379 QTC Calculation: 456 R Axis:   -7 Text Interpretation: Sinus rhythm Low  voltage, precordial leads Confirmed by 03-14-1984 [phone]) on 10/31/2020 9:19:50 PM  Radiology DG Chest Portable 1 View  Result Date: 10/31/2020 CLINICAL DATA:  Confusion, weakness x1 day EXAM: PORTABLE CHEST 1 VIEW COMPARISON:  08/04/2020 FINDINGS: Lungs are clear.  No pleural effusion or pneumothorax. The heart is normal in size.  Thoracic aortic atherosclerosis. IMPRESSION: No evidence of acute cardiopulmonary disease.  Electronically Signed   By: Charline BillsSriyesh  Krishnan M.D.   On: 10/31/2020 22:03   CT HEAD CODE STROKE WO CONTRAST  Result Date: 10/31/2020 CLINICAL DATA:  Code stroke. Initial evaluation for neuro deficit, stroke, left facial droop. Confusion. EXAM: CT HEAD WITHOUT CONTRAST TECHNIQUE: Contiguous axial images were obtained from the base of the skull through the vertex without intravenous contrast. COMPARISON:  CT from 03/27/2019. FINDINGS: Brain: Age-related cerebral atrophy with moderate chronic microvascular ischemic disease. Multiple remote lacunar infarcts present about the bilateral basal ganglia and right thalamus. No acute intracranial hemorrhage. No acute large vessel territory infarct. No mass lesion, midline shift or mass effect. Ventricular prominence related to global parenchymal volume loss without hydrocephalus. No extra-axial fluid collection. Vascular: No hyperdense vessel. Calcified atherosclerosis present at skull base. Skull: Scalp soft tissues and calvarium demonstrate no acute finding. Sinuses/Orbits: Globes and orbital soft tissues demonstrate no acute finding. Paranasal sinuses and mastoid air cells are clear. Other: None. ASPECTS [facility]([place] Stroke Program Early CT Score) - Ganglionic level infarction (caudate, lentiform nuclei, internal capsule, insula, M1-M3 cortex): 7 - Supraganglionic infarction (M4-M6 cortex): 3 Total score (0-10 with 10 being normal): 10 IMPRESSION: 1. No acute intracranial infarct or other abnormality. 2. ASPECTS is 10. 3. Age-related cerebral  atrophy with chronic small vessel ischemic disease, with multiple remote lacunar infarcts about the basal ganglia and right thalamus. These results were communicated to Dr. Wilford CornerArora at 8:21 pm on 10/31/2020 by text page via the [facility] messaging system. Electronically Signed   By: Rise MuBenjamin  McClintock M.D.   On: 10/31/2020 20:23    Procedures Procedures   Medications Ordered in ED Medications  nitrofurantoin (macrocrystal-monohydrate) (MACROBID) capsule 100 mg (100 mg Oral Given 10/31/20 2236)    ED Course  I have reviewed the triage vital signs and the nursing notes.  Pertinent labs & imaging results that were available during my care of the patient were reviewed by me and considered in my medical decision making (see chart for details).  Patient presents with some confusion and possibly slurred speech noticed by the daughter this morning.  Differential diagnoses include cerebrovascular accident versus infection including UTI versus other.  Patient arrives a code stroke to the timing of her symptoms, was evaluated by neurology in consultation and had CT scans which did not show acute infarct.  The neurologist is recommending MRI scan of the brain.  Patient is not a candidate for tPA or tenecteplase given no persistent deficits, and given her age and contraindications.    She otherwise has a fairly benign neuro exam for me, no evidence of large vessel obstruction.  Supplemental history provided by daughter at bedside.  I personally reviewed her prior medical records including her urine culture results from June and her urinalysis from 5 days ago.  Based on these results and her history, I do think it is very possible that she has UTI which may be contributing to her confusion.  Unfortunately her last organism is multidrug resistance.  We will need to send another urine culture, and I have ordered IV antibiotics based on the prior culture results.  CMP is otherwise unremarkable.  CBC within normal  limits.  No evidence of sepsis per her vital signs or white blood cell count.  Doubt meningitis clinically.  I reviewed her EKG which shows sinus rhythm no acute infarcts  Clinical Course as of 10/31/20 2242  Sun Oct 31, 2020  2122 Based on her last urine culture which appeared to be susceptible to Macrobid, this  is been ordered for UTI [MT]  2122 Per discussion with neurology, the patient will need medical admission for MRI imaging and further stroke work-up, as well as treatment for possible UTI. [MT]  2143 Admitted to family practice - discussed kidney function with pharmacy, unfortunately Macrobid appears to be the only oral option that is available based on her prior culture sensitivities.  Her creatinine clearance is within the limits for Macrobid.  We have agreed to start the dosing.  I have had a discussion with the family practice team who has assumed her care. [MT]    Clinical Course User Index [MT] Willey Due, Kermit Balo, MD    Final Clinical Impression(s) / ED Diagnoses Final diagnoses:  Urinary tract infection with hematuria, site unspecified  Weakness    Rx / DC Orders ED Discharge Orders     None        Terald Sleeper, MD 10/31/20 2242

## 2020-10-31 NOTE — ED Triage Notes (Signed)
[facility] - pt from independent living facility. LKW was 1145, pt's daughter was with her at this time. Another family member came to see her at 1400 and noticed pt had left sided facial droop, was drooling, and left arm weakness. Pt recently being treated for UTI, EMS states that pt has had tremors since UTI started.

## 2020-11-01 ENCOUNTER — Observation Stay (HOSPITAL_COMMUNITY): Payer: Medicare Other

## 2020-11-01 DIAGNOSIS — R29810 Facial weakness: Secondary | ICD-10-CM | POA: Diagnosis not present

## 2020-11-01 LAB — CBC
HCT: 41.4 % (ref 36.0–46.0)
Hemoglobin: 13.4 g/dL (ref 12.0–15.0)
MCH: 30.3 pg (ref 26.0–34.0)
MCHC: 32.4 g/dL (ref 30.0–36.0)
MCV: 93.7 fL (ref 80.0–100.0)
Platelets: 265 10*3/uL (ref 150–400)
RBC: 4.42 MIL/uL (ref 3.87–5.11)
RDW: 12.4 % (ref 11.5–15.5)
WBC: 6.9 10*3/uL (ref 4.0–10.5)
nRBC: 0 % (ref 0.0–0.2)

## 2020-11-01 LAB — BASIC METABOLIC PANEL
Anion gap: 8 (ref 5–15)
BUN: 19 mg/dL (ref 8–23)
CO2: 18 mmol/L — ABNORMAL LOW (ref 22–32)
Calcium: 8.9 mg/dL (ref 8.9–10.3)
Chloride: 112 mmol/L — ABNORMAL HIGH (ref 98–111)
Creatinine, Ser: 1.13 mg/dL — ABNORMAL HIGH (ref 0.44–1.00)
GFR, Estimated: 48 mL/min — ABNORMAL LOW (ref >60)
Glucose, Bld: 98 mg/dL (ref 70–99)
Potassium: 3.7 mmol/L (ref 3.5–5.1)
Sodium: 138 mmol/L (ref 135–145)

## 2020-11-01 MED ORDER — FOSFOMYCIN TROMETHAMINE 3 G PO PACK: 3.0000 g | PACK | Freq: Once | ORAL | 0 refills | Status: AC

## 2020-11-01 NOTE — ED Notes (Signed)
Report given to Christina C, RN  

## 2020-11-01 NOTE — Evaluation (Signed)
Physical Therapy Evaluation Patient Details Name: Heather Carson MRN: [id number] DOB: 04-29-1933 Today's Date: 11/01/2020  History of Present Illness  Heather Carson is a 87 y.o. female who presented to the ED for L facial droop and dysarthria on 10/31/20. CT-head with no acute changes and MRI pending. Tremulousness of whole body is baseline since last UTI. PMH is significant for Alzheimer's dementia, prior CVAs (residual L weakness), HTN, recurrent UTIs with MDR organism  Clinical Impression  Pt admitted with above diagnosis. At baseline, pt resides at ILF and has support throughout the day between family and PCAs.  Son reports can provide 24 hr support if needed.  Pt required assist with ADLs and ambulated short distances with supervision.  Today, pt presents with L sided weakness (baseline per son) but does have new facial droop and drooling on L .  She required min A for transfers (normally I with bed mobility).  Pt is below her baseline and will benefit from PT.  Recommend return to home as pt has support and can get HHPT/OT through her ILF.  Pt currently with functional limitations due to the deficits listed below (see PT Problem List). Pt will benefit from skilled PT to increase their independence and safety with mobility to allow discharge to the venue listed below.          Recommendations for follow up therapy are one component of a multi-disciplinary discharge planning process, led by the attending physician.  Recommendations may be updated based on patient status, additional functional criteria and insurance authorization.  Follow Up Recommendations Home health PT;Supervision/Assistance - 24 hour    Equipment Recommendations  None recommended by PT    Recommendations for Other Services       Precautions / Restrictions Precautions Precautions: Fall Restrictions Weight Bearing Restrictions: No      Mobility  Bed Mobility Overal bed mobility: Needs Assistance Bed Mobility:  Supine to Sit;Sit to Supine     Supine to sit: Min assist Sit to supine: Min assist   General bed mobility comments: verbal cues with increased time; requiring min A to lift trunk and scoot forwrad; and min A for legs back to bed    Transfers Overall transfer level: Needs assistance Equipment used: Rolling walker (2 wheeled) Transfers: Sit to/from Stand Sit to Stand: Min assist         General transfer comment: Light min A to stand and steady; cues for hand placement  Ambulation/Gait Ambulation/Gait assistance: Min assist Gait Distance (Feet): 50 Feet Assistive device: Rolling walker (2 wheeled) Gait Pattern/deviations: Step-to pattern;Decreased stride length Gait velocity: decreased   General Gait Details: Requiring multimodal cues and assist for RW; tends to look and drift right requiring min A to correct  Stairs            Wheelchair Mobility    Modified Rankin (Stroke Patients Only) Modified Rankin (Stroke Patients Only) Pre-Morbid Rankin Score: Moderately severe disability Modified Rankin: Moderately severe disability     Balance Overall balance assessment: Needs assistance Sitting-balance support: Feet supported;Bilateral upper extremity supported Sitting balance-Leahy Scale: Poor Sitting balance - Comments: able to sit at edge of stretcher without assist from therapist but did require UE support Postural control: Right lateral lean;Posterior lean Standing balance support: Bilateral upper extremity supported Standing balance-Leahy Scale: Poor Standing balance comment: requiring RW and min A  Pertinent Vitals/Pain Pain Assessment: No/denies pain     Home Living Family/patient expects to be discharged to:: Other (Comment) (ILF)               Home Equipment: Walker - 2 wheels;Walker - 4 wheels;Bedside commode;Shower seat;Wheelchair - manual Additional Comments: Pt has PCA every morning and every evening, 3  children rotate time with her throughout the day. She is typicallty alone during night (but has someone who puts her to bed and gets her up in morning). Reports does get up in night to urinate and uses BSC    Prior Function Level of Independence: Needs assistance   Gait / Transfers Assistance Needed: mostly mobilizies with wc, walks with RW or rollator with supervision, indep with surface transfers  ADL's / Homemaking Assistance Needed: PCA assists with morning and evening routine including bathing, dressing, med mgmt and meals. Pt is able to transfer to/from [facility] mod I, sometimes requiring asssit        Hand Dominance   Dominant Hand: Right    Extremity/Trunk Assessment   Upper Extremity Assessment Upper Extremity Assessment: Defer to OT evaluation    Lower Extremity Assessment Lower Extremity Assessment: LLE deficits/detail;RLE deficits/detail RLE Deficits / Details: ROM WFL; MMT 4+/5 RLE Sensation: WNL LLE Deficits / Details: ROM WFL; MMT 4/5; slightly weaker than R - son reports baseline LLE Sensation: WNL    Cervical / Trunk Assessment Cervical / Trunk Assessment: Kyphotic  Communication   Communication: No difficulties  Cognition Arousal/Alertness: Awake/alert;Lethargic Behavior During Therapy: Flat affect Overall Cognitive Status: History of cognitive impairments - at baseline                                 General Comments: Pt is oriented x 4; son present; L inattention (keeps head turned right but can look to L when cued - son reports pt tends to look right baseline)      General Comments General comments (skin integrity, edema, etc.): Son present during session and reports pt appears near baseline (slightly weaker and requiring assist for bed mobility). He was pleased to see what she did today. Agreeable to 24 hr support at home - with [facility] therapies    Exercises     Assessment/Plan    PT Assessment Patient needs continued PT services  PT  Problem List Decreased strength;Decreased mobility;Decreased safety awareness;Decreased coordination;Decreased activity tolerance;Decreased cognition;Decreased balance;Decreased knowledge of use of DME       PT Treatment Interventions DME instruction;Therapeutic activities;Gait training;Therapeutic exercise;Patient/family education;Balance training;Functional mobility training;Neuromuscular re-education;Wheelchair mobility training    PT Goals (Current goals can be found in the Care Plan section)  Acute Rehab PT Goals Patient Stated Goal: home soon PT Goal Formulation: With patient/family Time For Goal Achievement: 11/15/20 Potential to Achieve Goals: Good    Frequency Min 4X/week   Barriers to discharge        Co-evaluation               AM-PAC PT "6 Clicks" Mobility  Outcome Measure Help needed turning from your back to your side while in a flat bed without using bedrails?: A Little Help needed moving from lying on your back to sitting on the side of a flat bed without using bedrails?: A Little Help needed moving to and from a bed to a chair (including a wheelchair)?: A Little Help needed standing up from a chair using your arms (e.g., wheelchair or bedside  chair)?: A Little Help needed to walk in hospital room?: A Little Help needed climbing 3-5 steps with a railing? : A Lot 6 Click Score: 17    End of Session Equipment Utilized During Treatment: Gait belt Activity Tolerance: Patient tolerated treatment well Patient left: in bed;with call bell/phone within reach;with family/visitor present (ED stretcher) Nurse Communication: Mobility status PT Visit Diagnosis: Unsteadiness on feet (R26.81);Muscle weakness (generalized) (M62.81)    Time: 7673-4193 PT Time Calculation (min) (ACUTE ONLY): 26 min   Charges:   PT Evaluation $PT Eval Low Complexity: 1 Low PT Treatments $Gait Training: 8-22 mins        Anise Salvo, PT Acute Rehab Services Pager [phone] [facility] [phone]   Rayetta Humphrey 11/01/2020, 12:49 PM

## 2020-11-01 NOTE — Evaluation (Signed)
Occupational Therapy Evaluation Patient Details Name: Heather Carson MRN: [id number] DOB: 1933/05/10 Today's Date: 11/01/2020   History of Present Illness Heather Carson is a 87 y.o. female who presented to the ED for L facial droop and dysarthria. CT-head with no acute changes. Tremulousness of who body is baseline since last UTI. PMH is significant for Alzheimer's dementia, prior CVAs, HTN, recurrent UTIs with MDR organism   Clinical Impression   Pt was evaluated s/p the above admission list. PTA she was residing at an ILF with a PCA for morning and evening ADL/IADLs and her 3 children roated throughout the day to asst at needed. Pt mobilized with wc mostly, sometimes able to use walker with supervision for household distances. Upon evaluation pt required up to mod A for mobility with RW and max A for ADLs. See deficits and limitations below. Pt would benefit from OT acutely to progress towards her baseline. Recommend d/c back to ILF with 24/7 direct physical assistance, which her son verbally stated they (family & PCA) would provide.    Recommendations for follow up therapy are one component of a multi-disciplinary discharge planning process, led by the attending physician.  Recommendations may be updated based on patient status, additional functional criteria and insurance authorization.   Follow Up Recommendations  Home health OT;Supervision/Assistance - 24 hour (direct physical assist for all mobility and ADLs)    Equipment Recommendations  None recommended by OT (Pt has all necessary equipment)       Precautions / Restrictions Precautions Precautions: Fall Restrictions Weight Bearing Restrictions: No      Mobility Bed Mobility Overal bed mobility: Needs Assistance Bed Mobility: Supine to Sit;Sit to Supine     Supine to sit: Mod assist Sit to supine: Max assist   General bed mobility comments: mod A for verbal cues to sequence task, tunk elevation, and to adjust hips  toward EOB. Max A for trunk and BLE control back into bed    Transfers Overall transfer level: Needs assistance Equipment used: Rolling walker (2 wheeled) Transfers: Sit to/from Stand Sit to Stand: Mod assist         General transfer comment: Pt was min A for sit<>Stand from elevated surface; mod A overall for standing from lower chair-height surface    Balance Overall balance assessment: Needs assistance Sitting-balance support: Feet supported Sitting balance-Leahy Scale: Poor Sitting balance - Comments: Pt had R lateral and posterior bias, unabel to self-correct and required physical assist to balance at midline Postural control: Right lateral lean;Posterior lean Standing balance support: Bilateral upper extremity supported Standing balance-Leahy Scale: Poor                             ADL either performed or assessed with clinical judgement   ADL Overall ADL's : Needs assistance/impaired     Grooming: Sitting;Minimal assistance Grooming Details (indicate cue type and reason): min A for balance in sitting during dynamic UE task Upper Body Bathing: Moderate assistance;Sitting   Lower Body Bathing: Maximal assistance;Sit to/from stand   Upper Body Dressing : Minimal assistance;Sitting   Lower Body Dressing: Maximal assistance;Sit to/from stand Lower Body Dressing Details (indicate cue type and reason): total A to don bilat socks Toilet Transfer: BSC;RW;Moderate assistance Toilet Transfer Details (indicate cue type and reason): mod A for powering into standing, verbal cues, and balance in sitting and standing Toileting- Clothing Manipulation and Hygiene: Maximal assistance;Sit to/from stand Toileting - Architect Details (indicate cue  type and reason): max A for rear peri care in standing     Functional mobility during ADLs: Minimal assistance;Cueing for safety;Cueing for sequencing;Rolling walker General ADL Comments: assist levels due to  generalized weakness, baseline cognitive deficits, right lateral and posterior bias, balance and verbal cues for attention     Vision Baseline Vision/History: 0 No visual deficits Patient Visual Report: No change from baseline       Perception Perception Perception Tested?: No   Praxis Praxis Praxis tested?: Not tested    Pertinent Vitals/Pain Pain Assessment: Faces Faces Pain Scale: Hurts a little bit Pain Location: generalized with movement Pain Descriptors / Indicators: Discomfort;Grimacing Pain Intervention(s): Limited activity within patient's tolerance;Monitored during session;Repositioned     Hand Dominance Right   Extremity/Trunk Assessment Upper Extremity Assessment Upper Extremity Assessment: Generalized weakness;Difficult to assess due to impaired cognition (Pt able to reach arms over her head, 3/5 overall for bilat shoulder strength. bilat elbows 4/5 with WFL ROM)   Lower Extremity Assessment Lower Extremity Assessment: Generalized weakness (Pt required vc to pick up L foot when walking with rw, noted decrased step length with L and incrased effort)   Cervical / Trunk Assessment Cervical / Trunk Assessment: Kyphotic   Communication Communication Communication: No difficulties   Cognition Arousal/Alertness: Awake/alert;Lethargic Behavior During Therapy: WFL for tasks assessed/performed;Flat affect Overall Cognitive Status: History of cognitive impairments - at baseline                                 General Comments: verbal cues throughout to keep eyes open, required verbal cues throughout for attention and sequencing. Pt Ox4, recognized son upon his arrival.   General Comments  VSS on RA. Spoke wtih pt's son to obtain PLOF and home set up. Pt's son agreed to providing pt with 24/7 direct physical assist at d/c home.    Exercises     Shoulder Instructions      Home Living Family/patient expects to be discharged to:: Assisted living ([facility] in [place], [place])                             Home Equipment: Dan Humphreys - 2 wheels;Walker - 4 wheels;Bedside commode;Shower seat;Wheelchair - manual   Additional Comments: Pt has PCA every morning and every evening, 3 children rotate time with her throughout the day      Prior Functioning/Environment Level of Independence: Needs assistance  Gait / Transfers Assistance Needed: mostly mobilizies with wc, walks with RW or rollator with supervision, indep with durface transfers ADL's / Homemaking Assistance Needed: PCA assists with morning and evening routine including bathing, dressing, med mgmt and meals. Pt is able to transfer to/from [facility] mod I, sometimes requiring asssit Communication / Swallowing Assistance Needed: slow, deliberate, mildly slurry speech          OT Problem List: Decreased strength;Decreased range of motion;Decreased activity tolerance;Impaired balance (sitting and/or standing);Decreased safety awareness;Decreased knowledge of use of DME or AE;Decreased knowledge of precautions;Pain      OT Treatment/Interventions: Self-care/ADL training;Therapeutic exercise;DME and/or AE instruction;Therapeutic activities;Patient/family education;Balance training    OT Goals(Current goals can be found in the care plan section) Acute Rehab OT Goals Patient Stated Goal: home soon OT Goal Formulation: With patient/family Time For Goal Achievement: 11/15/20 Potential to Achieve Goals: Fair ADL Goals Pt Will Perform Grooming: with set-up;sitting Pt Will Perform Upper Body Bathing: with  min assist;sitting Pt Will Perform Lower Body Bathing: with mod assist;sit to/from stand Pt Will Perform Upper Body Dressing: with set-up;sitting Pt Will Perform Lower Body Dressing: with mod assist;sit to/from stand Pt Will Transfer to Toilet: with min guard assist;ambulating;bedside commode Pt Will Perform Toileting - Clothing Manipulation and hygiene: with min assist;sit  to/from stand  OT Frequency: Min 2X/week   Barriers to D/C:            Co-evaluation              AM-PAC OT "6 Clicks" Daily Activity     Outcome Measure Help from another person eating meals?: A Little Help from another person taking care of personal grooming?: A Little Help from another person toileting, which includes using toliet, bedpan, or urinal?: A Lot Help from another person bathing (including washing, rinsing, drying)?: A Lot Help from another person to put on and taking off regular upper body clothing?: A Little Help from another person to put on and taking off regular lower body clothing?: A Lot 6 Click Score: 15   End of Session Equipment Utilized During Treatment: Gait belt;Rolling walker Nurse Communication: Mobility status  Activity Tolerance: Patient tolerated treatment well Patient left: in bed;with call bell/phone within reach;with bed alarm set (MD and Son present)  OT Visit Diagnosis: Unsteadiness on feet (R26.81);Other abnormalities of gait and mobility (R26.89);Muscle weakness (generalized) (M62.81);Pain                Time: 6761-9509 OT Time Calculation (min): 20 min Charges:  OT General Charges $OT Visit: 1 Visit OT Evaluation $OT Eval Moderate Complexity: 1 Mod    Tulio Facundo A Breigh Annett 11/01/2020, 9:41 AM

## 2020-11-01 NOTE — Plan of Care (Addendum)
In brief:  The patient was supposed to have MRI today however, there is a spinal stimulator and MRI compatibility is not clear and awaiting callback from office tomorrow morning.  After above, RN called and stated the family did not want to wait for MRI and want to take her home because her symptoms have resolved. Asked RN to call attending.

## 2020-11-01 NOTE — Hospital Course (Addendum)
Taylar Hartsough is an 87 year old female who presented initially with left facial droop and dysarthria.  Past medical history is significant for Alzheimer's dementia, prior CVAs, hypertension, recurrent UTIs with MDRO organism.  Hospital course as outlined below.  Altered mental status that is now resolved with reported focal neuro deficits Patient with Alzheimer's dementia and normally lives in independent living but is able to take care of ADLs with the help of part-time caretaker.  Patient developed a left-sided facial droop and left arm weakness earlier in the day on 9/11 that caused the patient to go to the ED.  Per patient's daughter, when she gets a UTI the patient becomes confused and develops weakness.  Neuro was brought on board and an MRI of the brain was ordered.  MRI of the brain showed***.  UTI Patient has a history of frequent UTIs.  Per the patient's daughter patient was awaiting a urine culture to return to determine antibiotic therapy for a UTI that she had been experiencing.  UA showed small leukocyte esterase, positive nitrite and many bacteria.  Urine culture showed***.  Patient was started on fosfomycin 3 g one-time and another dose of 3 g in 3 days per pharmacy  Benzodiazepine Use Patient has regular nightly dosing of Xanax 0.5 mg for years and had a recent decrease in 0.25 mg a few months prior.  We continued the home Xanax dosage to 0.25 mg nightly at home melatonin for sleep.   Elevated Creatinine  Creatinine was initially 1.23 on admission and is now   Alzheimers Dementia  Continue home donepezil  Overactive Bladder  Continued home Mybetriq  Issues for follow up: Consider tapering xanax with eventual discontinuation.

## 2020-11-01 NOTE — Discharge Instructions (Addendum)
It was a pleasure to take care of you in the hospital.  While here you were treated for a urinary tract infection and possible stroke.  For UTI: please take another dose of fosfomycin (1 packet or 3 grams) in 3 days (9/15) Please follow up with your primary care physician for these problems and to follow up on urine culture results Please contact [employer] to get a new remote for the stimulator in your back. This way you will be able to get MRIs in the future. [phone]  If your symptoms of stroke recur, please return to the emergency department for evaluation.

## 2020-11-01 NOTE — Progress Notes (Signed)
Family Medicine Teaching Service Daily Progress Note Intern Pager: [phone]  Patient name: Heather Carson Medical record number: [id number] Date of birth: 1933-10-12 Age: 87 y.o. Gender: female  Primary Care Provider: Leola Brazil, DO Consultants: Neuro Code Status: DNR-confirmed with patient and her daughter/HCPOA  Pt Overview and Major Events to Date:  -Pt admitted overnight - 10/31/20  Assessment and Plan:  Heather Carson is a 87 y.o. female presenting with L facial droop and dysarthria. PMH is significant for Alzheimer's dementia, prior CVAs, HTN, recurrent UTIs with MDR organism  AMS (now resolved) with reported focal neuro deficits: Patient without FND on exam this morning, smile was symmetric as well as strength in all four extremities. Awaiting MRI, sacral stimulator needs to be switched to safe mode, but hasn't been used for extended period. If device cannot be switched to safe mode, patient will have to wait for MRI.  -Neuro on board, appreciate recommendations -MRI-Brain, if positive will pursue stroke workup per neurology -Frequent neuro-checks -PT/OT evaluate and treat -We will allow permissive hypertension at least until the MRI results -Cardiac monitoring  UTI: -Fosfomycin 3 g one-time followed by another dose of 3 g in 3 days per pharmacy -Urine culture pending   Benzodiazepine use: -Continue home Xanax, 0.25 mg QHS -Continue home melatonin 10 mg for sleep -Recommend tapering Xanax at discharge with plan to eventually discontinue   Elevated Cr: On previous admission 1 yr ago , patient with Cr ranging from 1.01-1.21. Currently 1.23. -Encourage PO fluids -BMP in the a.m. -Consider IV fluids if creatinine is not improving with oral hydration in the morning    Alzheimer's dementia: Mental and functional status as above -Continue home donepezil 5 mg  Overactive Bladder: -Continue Mybetriq 25 mg  FEN/GI: Regular Diet PPx: Lovenox Dispo:Home with  home health   Pending clinical improvement . Barriers include awaiting an MRI.   Subjective:  No events overnight, patient was visited by her son this morning and reports no weakness in extremities.   Objective: Temp:  [98 F (36.7 C)] 98 F (36.7 C) (09/11 2010) Pulse Rate:  [64-92] 66 (09/12 0530) Resp:  [13-20] 15 (09/12 0530) BP: (142-187)/(55-83) 152/58 (09/12 0530) SpO2:  [96 %-100 %] 98 % (09/12 0530) Weight:  [72.6 kg] 72.6 kg (09/11 2032) Physical Exam: General: Frail, alert and oriented to person and place Cardiovascular: RRR, NRMG Respiratory: CTABL, no wheezes or stridor Abdomen: Soft, non-tender, non-distended Extremities: Pulses 2+ in all extremities  Laboratory: Recent Labs  Lab 10/31/20 2002 10/31/20 2008 11/01/20 0513  WBC 8.0  --  6.9  HGB 14.2 13.9 13.4  HCT 44.7 41.0 41.4  PLT 283  --  265   Recent Labs  Lab 10/31/20 2002 10/31/20 2008 11/01/20 0513  NA 140 141 138  K 4.1 4.1 3.7  CL 109 107 112*  CO2 21*  --  18*  BUN 23 27* 19  CREATININE 1.23* 1.20* 1.13*  CALCIUM 9.1  --  8.9  PROT 6.5  --   --   BILITOT 0.5  --   --   ALKPHOS 60  --   --   ALT 14  --   --   AST 16  --   --   GLUCOSE 136* 127* 98    UA: small leukocytes, positive nitrites Ucx: NGTD Imaging/Diagnostic Tests:  CXR: Unremarkable ABDXR: Surgical clipse with sacral stimulator left pelvis  Heather Kinds, MD 11/01/2020, 5:52 AM PGY-1, [facility] FPTS Intern pager: [phone], text  pages welcome

## 2020-11-03 LAB — URINE CULTURE: Culture: >=100000 — AB

## 2020-11-04 ENCOUNTER — Telehealth: Payer: Self-pay | Admitting: *Deleted

## 2020-11-04 NOTE — Telephone Encounter (Signed)
Post ED Visit - Positive Culture Follow-up  Culture report reviewed by antimicrobial stewardship pharmacist: [facility] []  , Pharm.D. []  Enzo Bi, .D., BCPS AQ-ID []  Celedonio Miyamoto, Pharm.D., BCPS []  [address], Pharm.D., BCPS []  [place], Garvin Fila.D., BCPS, AAHIVP []  , Pharm.D., BCPS, AAHIVP []  Georgina Pillion, PharmD, BCPS []  , PharmD, BCPS []  [place], PharmD, BCPS []  [address], PharmD []  , PharmD, BCPS []  Estella Husk, PharmD  Pharmacy Team []  Lysle Pearl, PharmD []  , PharmD []  Phillips Climes, PharmD []  , Rph []  Agapito Games) , PharmD []  Verlan Friends, PharmD []  , PharmD []  Mervyn Gay, PharmD []  , PharmD []  Vinnie Level, PharmD []  [facility], PharmD []  , PharmD []  Len Childs, PharmD   Positive urine culture Treated with Fosfomycin, organism sensitive to the same and no further patient follow-up is required at this time. , PharmD Greer Pickerel Talley 11/04/2020, 8:43 AM

## 2022-01-04 DIAGNOSIS — I6389 Other cerebral infarction: Secondary | ICD-10-CM | POA: Diagnosis not present

## 2022-01-20 DEATH — deceased

## 2022-05-19 IMAGING — CR DG ABDOMEN 1V
1 series · 1 of 1 positions shown · non-contrast
Comparison: No comparison studies available.

CLINICAL DATA: MRI clearance.

EXAM:
ABDOMEN - 1 VIEW

[abdomen kub]
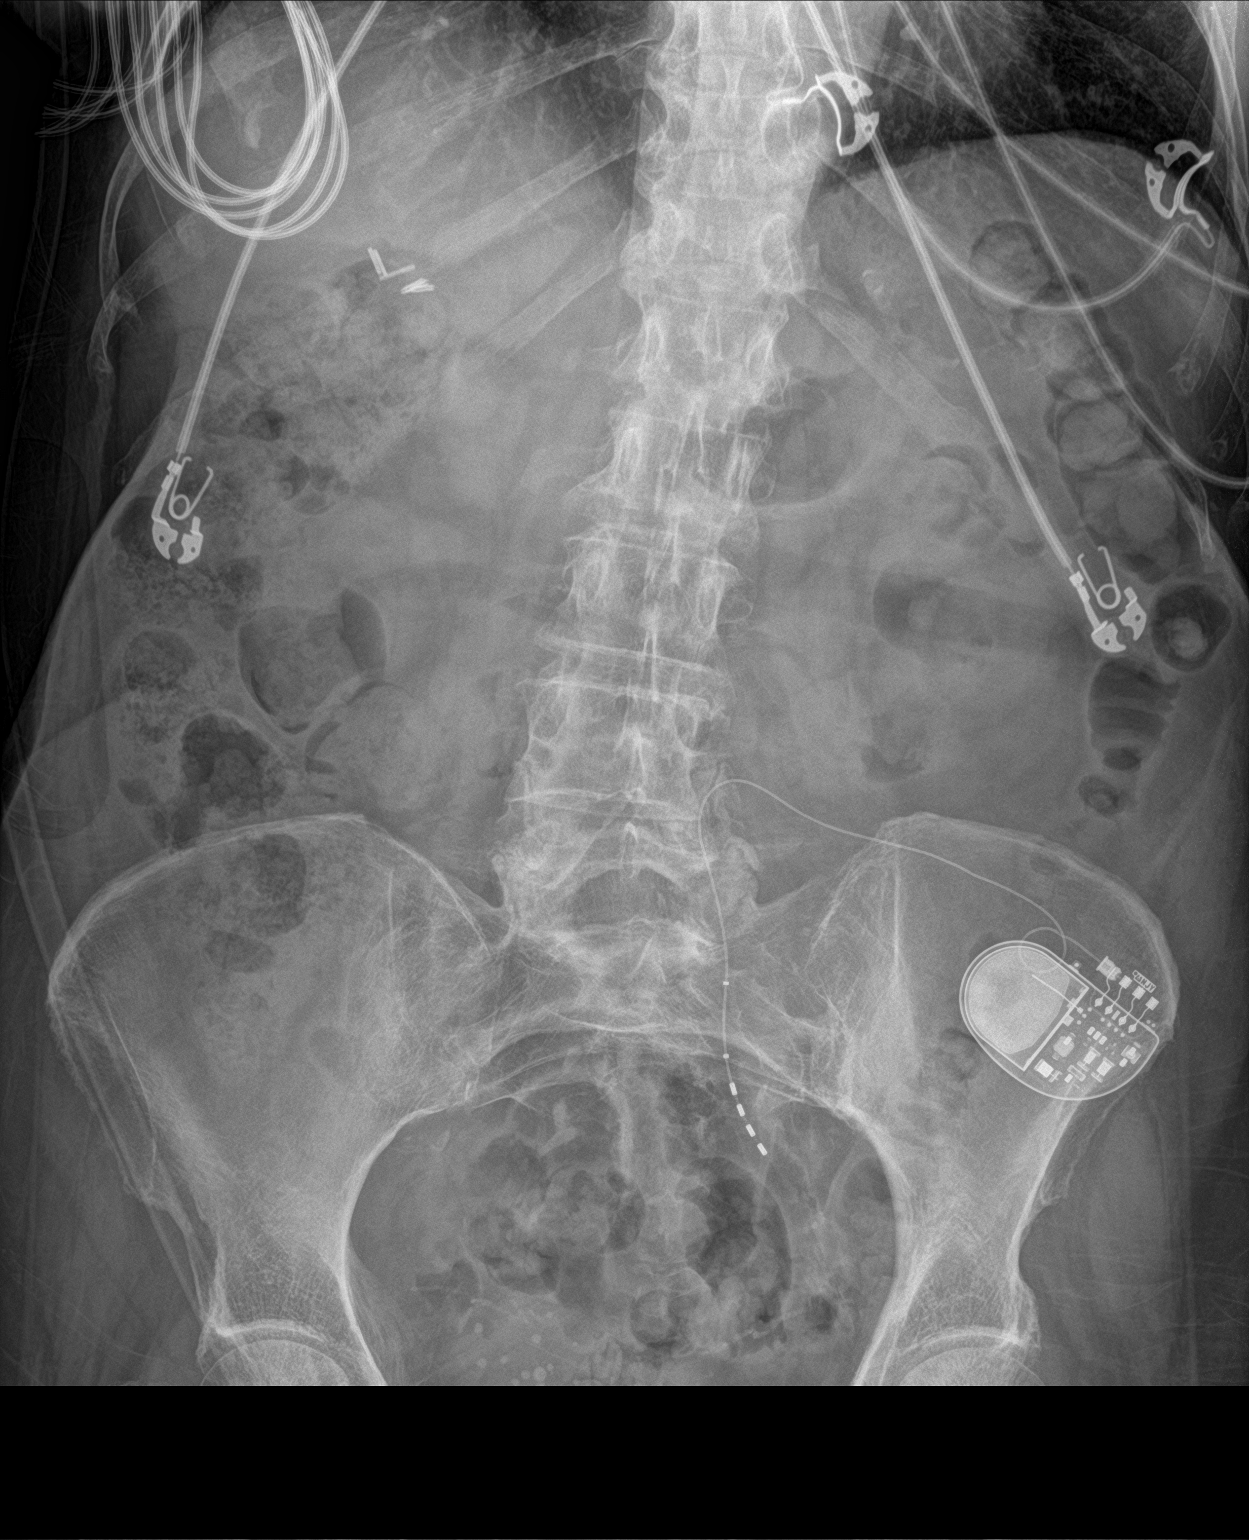

[1 of 1 positions shown; findings below may reference images not displayed]

FINDINGS: Surgical clips in the right upper quadrant suggest prior
cholecystectomy. Sacral stimulator device identified with battery
pack overlying the left iliac bone laterally. Nonspecific bowel gas
pattern with moderate stool volume evident. Bones are diffusely
demineralized.
IMPRESSION: 1. Surgical clips are noted in the right upper quadrant with sacral
stimulator device noted left pelvis.
